# Patient Record
Sex: Female | Born: 1980
Health system: Southern US, Community
[De-identification: ages and names within clinical notes are randomized; demographics above are authoritative.]

## PROBLEM LIST (undated history)

## (undated) ENCOUNTER — Inpatient Hospital Stay (HOSPITAL_COMMUNITY): Payer: Self-pay

## (undated) DIAGNOSIS — D649 Anemia, unspecified: Secondary | ICD-10-CM

## (undated) DIAGNOSIS — F909 Attention-deficit hyperactivity disorder, unspecified type: Secondary | ICD-10-CM

## (undated) HISTORY — PX: DILATION AND CURETTAGE OF UTERUS: SHX78

## (undated) HISTORY — DX: Attention-deficit hyperactivity disorder, unspecified type: F90.9

## (undated) HISTORY — DX: Anemia, unspecified: D64.9

---
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
## 2015-10-31 DIAGNOSIS — G4700 Insomnia, unspecified: Secondary | ICD-10-CM | POA: Diagnosis not present

## 2015-10-31 DIAGNOSIS — F909 Attention-deficit hyperactivity disorder, unspecified type: Secondary | ICD-10-CM | POA: Diagnosis not present

## 2015-10-31 LAB — CBC AND DIFFERENTIAL
HEMATOCRIT: 39 (ref 36–46)
Hemoglobin: 13.1 (ref 12.0–16.0)
NEUTROS ABS: 5
Platelets: 311 (ref 150–399)
WBC: 7.2

## 2015-10-31 LAB — LIPID PANEL
Cholesterol: 179 (ref 0–200)
HDL: 72 — AB (ref 35–70)
LDL CALC: 96
TRIGLYCERIDES: 53 (ref 40–160)

## 2015-10-31 LAB — HEPATIC FUNCTION PANEL
ALT: 11 (ref 7–35)
AST: 15 (ref 13–35)
Alkaline Phosphatase: 29 (ref 25–125)
Bilirubin, Total: 0.4

## 2015-10-31 LAB — BASIC METABOLIC PANEL
BUN: 14 (ref 4–21)
Creatinine: 0.6 (ref <=1.1)
GLUCOSE: 95
Potassium: 4.3 (ref 3.4–5.3)
SODIUM: 138 (ref 137–147)

## 2015-10-31 LAB — HEMOGLOBIN A1C: Hgb A1c MFr Bld: 5.7 (ref 4.0–6.0)

## 2016-01-21 DIAGNOSIS — H5203 Hypermetropia, bilateral: Secondary | ICD-10-CM | POA: Diagnosis not present

## 2016-01-21 DIAGNOSIS — H52221 Regular astigmatism, right eye: Secondary | ICD-10-CM | POA: Diagnosis not present

## 2016-03-04 DIAGNOSIS — F909 Attention-deficit hyperactivity disorder, unspecified type: Secondary | ICD-10-CM | POA: Diagnosis not present

## 2016-04-08 MED FILL — DEXTROAMP-AMP 10 MG TAB: 10 | 30 days supply | Qty: 90 | Fill #0

## 2016-05-06 MED FILL — DEXTROAMP-AMP 10 MG TAB: 10 | 30 days supply | Qty: 90 | Fill #0

## 2016-06-12 DIAGNOSIS — F909 Attention-deficit hyperactivity disorder, unspecified type: Secondary | ICD-10-CM | POA: Diagnosis not present

## 2016-06-12 DIAGNOSIS — D229 Melanocytic nevi, unspecified: Secondary | ICD-10-CM | POA: Diagnosis not present

## 2016-06-15 MED FILL — AMPHETAMINE SALTS 10 MG TAB: 10 | 90 days supply | Qty: 90 | Fill #0

## 2016-07-20 MED FILL — DEXTROAMP-AMP 10 MG TAB: 10 | 30 days supply | Qty: 90 | Fill #0

## 2016-08-17 MED FILL — DEXTROAMP-AMP 10 MG TAB: 10 | 30 days supply | Qty: 90 | Fill #0

## 2016-09-07 HISTORY — PX: BREAST REDUCTION SURGERY: SHX8

## 2016-10-19 DIAGNOSIS — F909 Attention-deficit hyperactivity disorder, unspecified type: Secondary | ICD-10-CM | POA: Diagnosis not present

## 2016-10-20 MED FILL — DEXTROAMP-AMPHETAMIN 10 MG: 10 | 30 days supply | Qty: 90 | Fill #0

## 2016-10-30 ENCOUNTER — Inpatient Hospital Stay (HOSPITAL_COMMUNITY)
Admission: AD | Admit: 2016-10-30 | Discharge: 2016-10-30 | Disposition: A | Discharge status: Home / Self Care | Payer: 59 | Source: Ambulatory Visit | Attending: Obstetrics & Gynecology | Admitting: Obstetrics & Gynecology

## 2016-10-30 ENCOUNTER — Inpatient Hospital Stay (HOSPITAL_COMMUNITY): Payer: 59

## 2016-10-30 ENCOUNTER — Encounter (HOSPITAL_COMMUNITY): Payer: Self-pay | Admitting: *Deleted

## 2016-10-30 DIAGNOSIS — Z885 Allergy status to narcotic agent status: Secondary | ICD-10-CM | POA: Insufficient documentation

## 2016-10-30 DIAGNOSIS — Z3A09 9 weeks gestation of pregnancy: Secondary | ICD-10-CM | POA: Insufficient documentation

## 2016-10-30 DIAGNOSIS — Z881 Allergy status to other antibiotic agents status: Secondary | ICD-10-CM | POA: Insufficient documentation

## 2016-10-30 DIAGNOSIS — O021 Missed abortion: Secondary | ICD-10-CM

## 2016-10-30 DIAGNOSIS — O209 Hemorrhage in early pregnancy, unspecified: Secondary | ICD-10-CM | POA: Diagnosis present

## 2016-10-30 LAB — URINALYSIS, ROUTINE W REFLEX MICROSCOPIC
Bilirubin Urine: NEGATIVE
Glucose, UA: NEGATIVE mg/dL
KETONES UR: NEGATIVE mg/dL
Nitrite: NEGATIVE
PROTEIN: NEGATIVE mg/dL
Specific Gravity, Urine: 1.012 (ref 1.005–1.030)
pH: 5 (ref 5.0–8.0)

## 2016-10-30 LAB — CBC
HCT: 35.4 % — ABNORMAL LOW (ref 36.0–46.0)
Hemoglobin: 12.8 g/dL (ref 12.0–15.0)
MCH: 33.3 pg (ref 26.0–34.0)
MCHC: 36.2 g/dL — ABNORMAL HIGH (ref 30.0–36.0)
MCV: 92.2 fL (ref 78.0–100.0)
PLATELETS: 288 10*3/uL (ref 150–400)
RBC: 3.84 MIL/uL — AB (ref 3.87–5.11)
RDW: 12.4 % (ref 11.5–15.5)
WBC: 9.7 10*3/uL (ref 4.0–10.5)

## 2016-10-30 LAB — WET PREP, GENITAL
CLUE CELLS WET PREP: NONE SEEN
Sperm: NONE SEEN
TRICH WET PREP: NONE SEEN
Yeast Wet Prep HPF POC: NONE SEEN

## 2016-10-30 LAB — POCT PREGNANCY, URINE: PREG TEST UR: POSITIVE — AB

## 2016-10-30 LAB — ABO/RH: ABO/RH(D): A POS

## 2016-10-30 LAB — HCG, QUANTITATIVE, PREGNANCY: HCG, BETA CHAIN, QUANT, S: 67001 m[IU]/mL — AB (ref <5)

## 2016-10-30 MED ORDER — TRAMADOL HCL 50 MG PO TABS: 50.0000 mg | ORAL_TABLET | Freq: Four times a day (QID) | ORAL | 0 refills | Status: DC | PRN

## 2016-10-30 MED ORDER — PROMETHAZINE HCL 25 MG PO TABS: 25.0000 mg | ORAL_TABLET | Freq: Four times a day (QID) | ORAL | 2 refills | Status: DC | PRN

## 2016-10-30 MED ORDER — IBUPROFEN 800 MG PO TABS: 800.0000 mg | ORAL_TABLET | Freq: Three times a day (TID) | ORAL | 3 refills | Status: DC | PRN

## 2016-10-30 MED ORDER — MISOPROSTOL 200 MCG PO TABS: ORAL_TABLET | ORAL | 1 refills | Status: DC

## 2016-10-30 NOTE — MAU Note (Signed)
Had a lot of bleeding and clotting on 2/2, lasted a couple days.  Turned brown, small amt daily. No pain. Hx of miscarriages

## 2016-10-30 NOTE — MAU Provider Note (Signed)
[facility] MAU Attending Note  History     CSN: [id number]  Arrival date & time 10/30/16  1736   First Provider Initiated Contact with Patient 10/30/16 1827      Chief Complaint  Patient presents with  . Vaginal Bleeding  . Possible Pregnancy    Abigail Smith is a 36 y.o. G6P0050 at [redacted]w[redacted]d by LMP who presents to MAU today for evaluation of bleeding in early pregnancy.  Patient had bleeding on 10/09/16, no active bleeding after two days but persistent brown discharge. No abdominal cramping or other associated symptoms. She has a history of 2 previous SABs, she is worried she is having her third one now.  Had TAB x 3 (two surgical, one medication), previous GYN told her she may have intrauterine scarring, but she never underwent hysteroscopy for diagnosis.  Has not had any other recurrent SAB evaluation.  Denies any abnormal vaginal discharge, fevers, chills, sweats, dysuria, nausea, vomiting, other GI or GU symptoms or other general symptoms.   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 Accompanied by her FOB, who is a critical care RN at [employer].    Obstetric History   G6   P0   T0   P0   A5   L0    SAB2   TAB3   Ectopic0   Multiple0   Live Births0     # Outcome Date GA Lbr Len/2nd Weight Sex Delivery Anes PTL Lv  6 Current           5 SAB           4 SAB           3 TAB           2 TAB           1 TAB               History reviewed. No pertinent past medical history.  Past Surgical History:  Procedure Laterality Date  . BREAST REDUCTION SURGERY      Family History  Problem Relation Age of Onset  . Hypertension Mother   . Heart disease Brother     Social History  Substance Use Topics  . Smoking status: Never Smoker  . Smokeless tobacco: Never Used  . Alcohol use 1.8 - 2.4 oz/week    3 - 4 Glasses of wine per week    Allergies  Allergen Reactions  . Vicodin [Hydrocodone-Acetaminophen] Hives  . Vancomycin Other (See Comments)    Red  man syndrome    No prescriptions prior to admission.     Physical Exam  BP 128/77 (BP Location: Left Arm)   Pulse 81   Temp 98.2 F (36.8 C) (Oral)   Resp 18   Ht 5\' 7"  (1.702 m)   Wt 154 lb (69.9 kg)   LMP 08/28/2016   BMI 24.12 kg/m  GENERAL: Well-developed, well-nourished female in no acute distress  SKIN: Warm, dry and without erythema PSYCH: Normal mood and affect HEENT: Normocephalic, atraumatic.   LUNGS: Normal respiratory effort, normal breath sounds HEART: Regular rate noted ABDOMEN: Soft, nondistended, nontender  PELVIC: Small amount of brown discharge seen, no active bleeding.  Nulliparous cervix, closed. No abnormal discharge.  Samples obtained for testing. EXTREMITIES: No edema, no cyanosis, normal range of movement  MAU Course/MDM  Wet prep, GC/Chlam, UA sent Labs ordered: CBC, HIV, ABO, BHCG Pelvic ultrasound ordered.  Labs and Imaging   Results  for orders placed or performed during the hospital encounter of 10/30/16 (from the past 24 hour(s))  Urinalysis, Routine w reflex microscopic     Status: Abnormal   Collection Time: 10/30/16  5:40 PM  Result Value Ref Range   Color, Urine YELLOW YELLOW   APPearance CLOUDY (A) CLEAR   Specific Gravity, Urine 1.012 1.005 - 1.030   pH 5.0 5.0 - 8.0   Glucose, UA NEGATIVE NEGATIVE mg/dL   Hgb urine dipstick LARGE (A) NEGATIVE   Bilirubin Urine NEGATIVE NEGATIVE   Ketones, ur NEGATIVE NEGATIVE mg/dL   Protein, ur NEGATIVE NEGATIVE mg/dL   Nitrite NEGATIVE NEGATIVE   Leukocytes, UA LARGE (A) NEGATIVE   RBC / HPF 0-5 0 - 5 RBC/hpf   WBC, UA 6-30 0 - 5 WBC/hpf   Bacteria, UA RARE (A) NONE SEEN   Squamous Epithelial / LPF 6-30 (A) NONE SEEN   Mucous PRESENT   Pregnancy, urine POC     Status: Abnormal   Collection Time: 10/30/16  5:57 PM  Result Value Ref Range   Preg Test, Ur POSITIVE (A) NEGATIVE  CBC     Status: Abnormal   Collection Time: 10/30/16  6:08 PM  Result Value Ref Range   WBC 9.7 4.0 - 10.5 K/uL    RBC 3.84 (L) 3.87 - 5.11 MIL/uL   Hemoglobin 12.8 12.0 - 15.0 g/dL   HCT 35.4 (L) 36.0 - 46.0 %   MCV 92.2 78.0 - 100.0 fL   MCH 33.3 26.0 - 34.0 pg   MCHC 36.2 (H) 30.0 - 36.0 g/dL   RDW 12.4 11.5 - 15.5 %   Platelets 288 150 - 400 K/uL  ABO/Rh     Status: None (Preliminary result)   Collection Time: 10/30/16  6:08 PM  Result Value Ref Range   ABO/RH(D) A POS   hCG, quantitative, pregnancy     Status: Abnormal   Collection Time: 10/30/16  6:08 PM  Result Value Ref Range   hCG, Beta Chain, Quant, S 67,001 (H) <5 mIU/mL  Wet prep, genital     Status: Abnormal   Collection Time: 10/30/16  6:19 PM  Result Value Ref Range   Yeast Wet Prep HPF POC NONE SEEN NONE SEEN   Trich, Wet Prep NONE SEEN NONE SEEN   Clue Cells Wet Prep HPF POC NONE SEEN NONE SEEN   WBC, Wet Prep HPF POC MANY (A) NONE SEEN   Sperm NONE SEEN    Preliminary ultrasound report:  Likely twin missed abortion. One fetal pole, no cardiac activity. No fetal pole seen in other sac  Assessment and Plan   1. Missed abortion   2. Vaginal bleeding in pregnancy, first trimester    Discussed management of missed abortion: expectant management vs misoprostol vs D&E.  Risks and benefits of all modalities discussed; all questions answered.  Patient opted  For misoprostol administration.  The protocol/checklist is below:  Early Intrauterine Pregnancy Failure Treatment with Misoprostol  _X__  Documented intrauterine pregnancy failure less than or equal to [redacted] weeks gestation  __X_  No serious current illness  __X_  Baseline Hgb greater than or equal to 10g/dl  _X__  Patient has easily accessible transportation to the hospital  _X__  Clear preference  _X__  Practitioner/physician deems patient reliable  _X__  Counseling by practitioner or physician  _X__  Patient education by RN  _N/A__  Rho-Gam given by RN if indicated  __X_ Medication dispensed   _X__   Cytotec 800 mcg  __   Intravaginally by patient at  home         __   Intravaginally by RN in MAU/Clinic        __   Rectally by patient at home        __   Rectally by RN in MAU/Clinic  _X__  Ibuprofen 800 mg 1 tablet by mouth every 8 hours as needed #30  _X__  Tramadol 50 mg 1 tablet by mouth every 6 hours as needed  #20  ___  Phenergan 25 mg by mouth every 6 hours as needed for nausea #20  Risks and benefits of medical management were carefully explained, including a success rate of 80-90%, the need for another person to be at home with her, and to call/come in if she had heavy bleeding, dizziness, or severe pain not relieved by medication.  She will follow up in one week after misoprostol administration; if there has been no passage of pregnancy, will consider repeat misoprostol vs D&E.  Patient was advised to call or come in for evaluation for any concerning symptoms; bleeding precautions strictly emphasized.    Follow-up Information    Verita Schneiders, MD Follow up on 11/05/2016.   Specialty:  Obstetrics and Gynecology Why:  You will be called with appointment time Contact information: [address] [place] [address] [phone]          Allergies as of 10/30/2016      Reactions   Vicodin [hydrocodone-acetaminophen] Hives   Vancomycin Other (See Comments)   Red man syndrome      Medication List    TAKE these medications   ibuprofen 800 MG tablet Commonly known as:  ADVIL,MOTRIN Take 1 tablet (800 mg total) by mouth 3 (three) times daily with meals as needed for headache or moderate pain.   misoprostol 200 MCG tablet Commonly known as:  CYTOTEC Place four tablets in between your gums and cheeks (two tablets on each side) OR insert four tablets vaginally   promethazine 25 MG tablet Commonly known as:  PHENERGAN Take 1 tablet (25 mg total) by mouth every 6 (six) hours as needed for nausea or vomiting.   traMADol 50 MG tablet Commonly known as:  ULTRAM Take 1 tablet (50 mg total) by mouth every 6 (six) hours  as needed for severe pain.        Verita Schneiders, MD, [facility] Attending [place], [facility] for [employer], [place]

## 2016-10-30 NOTE — Discharge Instructions (Signed)
[email or web address] for support  Specialists: Dr. Kerin Perna, [facility] or [facility] on [address]?     Miscarriage A miscarriage is the sudden loss of an unborn baby (fetus) before the 20th week of pregnancy. Most miscarriages happen in the first 3 months of pregnancy. Sometimes, it happens before a woman even knows she is pregnant. A miscarriage is also called a "spontaneous miscarriage" or "early pregnancy loss." Having a miscarriage can be an emotional experience. Talk with your caregiver about any questions you may have about miscarrying, the grieving process, and your future pregnancy plans. What are the causes?  Problems with the fetal chromosomes that make it impossible for the baby to develop normally. Problems with the baby's genes or chromosomes are most often the result of errors that occur, by chance, as the embryo divides and grows. The problems are not inherited from the parents.  Infection of the cervix or uterus.  Hormone problems.  Problems with the cervix, such as having an incompetent cervix. This is when the tissue in the cervix is not strong enough to hold the pregnancy.  Problems with the uterus, such as an abnormally shaped uterus, uterine fibroids, or congenital abnormalities.  Certain medical conditions.  Smoking, drinking alcohol, or taking illegal drugs.  Trauma. Often, the cause of a miscarriage is unknown. What are the signs or symptoms?  Vaginal bleeding or spotting, with or without cramps or pain.  Pain or cramping in the abdomen or lower back.  Passing fluid, tissue, or blood clots from the vagina. How is this diagnosed? Your caregiver will perform a physical exam. You may also have an ultrasound to confirm the miscarriage. Blood or urine tests may also be ordered. How is this treated?  Sometimes, treatment is not necessary if you naturally pass all the fetal tissue that was in the uterus. If some of the fetus  or placenta remains in the body (incomplete miscarriage), tissue left behind may become infected and must be removed.   Medication can be used to help you pass the tissue  Dilation and curettage (D and C) procedure also can be performed. During a D and C procedure, the cervix is widened (dilated) and any remaining fetal or placental tissue is gently removed from the uterus.  Antibiotic medicines are prescribed if there is an infection. Other medicines may be given to reduce the size of the uterus (contract) if there is a lot of bleeding.  If you have Rh negative blood and your baby was Rh positive, you will need a Rh immunoglobulin shot. This shot will protect any future baby from having Rh blood problems in future pregnancies. Follow these instructions at home:  Your caregiver may order bed rest or may allow you to continue light activity. Resume activity as directed by your caregiver.  Have someone help with home and family responsibilities during this time.  Keep track of the number of sanitary pads you use each day and how soaked (saturated) they are. Write down this information.  Do not use tampons. Do not douche or have sexual intercourse until approved by your caregiver.  Only take over-the-counter or prescription medicines for pain or discomfort as directed by your caregiver.  Do not take aspirin. Aspirin can cause bleeding.  Keep all follow-up appointments with your caregiver.  If you or your partner have problems with grieving, talk to your caregiver or seek counseling to help cope with the pregnancy loss. Allow enough time to grieve before trying to get pregnant  again. Get help right away if:  You have severe cramps or pain in your back or abdomen.  You have a fever.  You pass large blood clots (walnut-sized or larger) ortissue from your vagina. Save any tissue for your caregiver to inspect.  Your bleeding increases.  You have a thick, bad-smelling vaginal  discharge.  You become lightheaded, weak, or you faint.  You have chills. This information is not intended to replace advice given to you by your health care provider. Make sure you discuss any questions you have with your health care provider. Document Released: 02/17/2001 Document Revised: 01/30/2016 Document Reviewed: 10/13/2011 Elsevier Interactive Patient Education  2017 [employer].

## 2016-10-31 LAB — HIV ANTIBODY (ROUTINE TESTING W REFLEX): HIV Screen 4th Generation wRfx: NONREACTIVE

## 2016-11-02 ENCOUNTER — Telehealth: Payer: Self-pay | Admitting: Radiology

## 2016-11-02 LAB — GC/CHLAMYDIA PROBE AMP (~~LOC~~) NOT AT ARMC
Chlamydia: NEGATIVE
Neisseria Gonorrhea: NEGATIVE

## 2016-11-02 MED FILL — miSOPROStol 200 MCG TABS: 200 | 1 days supply | Qty: 4 | Fill #0

## 2016-11-02 MED FILL — IBUPROFEN 800 MG TABLET: 800 | 10 days supply | Qty: 30 | Fill #0

## 2016-11-02 MED FILL — PROMETHAZINE 25 MG TABLET: 25 | 5 days supply | Qty: 20 | Fill #0

## 2016-11-02 NOTE — Telephone Encounter (Signed)
-----   Message from Osborne Oman, MD sent at 10/30/2016  7:40 PM EST ----- Regarding: Needs to follow up with me Please add her on for me on 11/05/16 for follow up miscarriage.  Thank you!  [facility]

## 2016-11-02 NOTE — Telephone Encounter (Signed)
Called and left a message on the cell phone voicemail to call office to schedule appointment for 11/05/16

## 2016-11-05 MED FILL — traMADol HCL 50 MG TABS: 50 | 5 days supply | Qty: 20 | Fill #0

## 2016-11-24 MED FILL — DEXTROAMP-AMPHETAMIN 10 MG: 10 | 30 days supply | Qty: 90 | Fill #0

## 2016-12-31 MED FILL — AMPHETAMINE SALTS 10 MG TAB: 10 | 30 days supply | Qty: 90 | Fill #0

## 2017-02-09 DIAGNOSIS — F909 Attention-deficit hyperactivity disorder, unspecified type: Secondary | ICD-10-CM | POA: Diagnosis not present

## 2017-02-09 DIAGNOSIS — L309 Dermatitis, unspecified: Secondary | ICD-10-CM | POA: Diagnosis not present

## 2017-02-10 MED FILL — DEXTROAMP-AMPHETAMIN 20 MG: 20 | 30 days supply | Qty: 60 | Fill #0

## 2017-03-19 MED FILL — DEXTROAMP-AMPHETAMIN 20 MG: 20 | 30 days supply | Qty: 60 | Fill #0

## 2017-05-12 MED FILL — AMPHETAMINE SALTS 20 MG TAB: 20 | 30 days supply | Qty: 60 | Fill #0

## 2017-05-25 MED FILL — IBUPROFEN 800 MG TABS: 800 | 10 days supply | Qty: 30 | Fill #1

## 2017-07-27 DIAGNOSIS — R7301 Impaired fasting glucose: Secondary | ICD-10-CM | POA: Diagnosis not present

## 2017-07-27 DIAGNOSIS — F909 Attention-deficit hyperactivity disorder, unspecified type: Secondary | ICD-10-CM | POA: Diagnosis not present

## 2017-07-28 MED FILL — DEXTROAMP-AMPHETAMIN 20 MG: 20 | 30 days supply | Qty: 60 | Fill #0

## 2017-08-27 ENCOUNTER — Encounter (HOSPITAL_COMMUNITY): Payer: Self-pay

## 2017-09-14 MED FILL — AMPHETAMINE-DEXTRO 20MG: 20 | 30 days supply | Qty: 60 | Fill #0

## 2017-09-29 MED FILL — IBUPROFEN 800 MG TABS: 800 | 10 days supply | Qty: 30 | Fill #2

## 2017-10-26 MED FILL — AMPHETAMINE-DEXTRO 20MG: 20 | 30 days supply | Qty: 60 | Fill #0

## 2018-01-19 ENCOUNTER — Ambulatory Visit (INDEPENDENT_AMBULATORY_CARE_PROVIDER_SITE_OTHER): Payer: No Typology Code available for payment source | Admitting: Adult Health

## 2018-01-19 ENCOUNTER — Encounter: Payer: Self-pay | Admitting: Adult Health

## 2018-01-19 VITALS — BP 115/74 | HR 84 | Ht 66.5 in | Wt 157.5 lb

## 2018-01-19 DIAGNOSIS — G4700 Insomnia, unspecified: Secondary | ICD-10-CM | POA: Diagnosis not present

## 2018-01-19 DIAGNOSIS — F411 Generalized anxiety disorder: Secondary | ICD-10-CM | POA: Diagnosis not present

## 2018-01-19 DIAGNOSIS — F909 Attention-deficit hyperactivity disorder, unspecified type: Secondary | ICD-10-CM

## 2018-01-19 DIAGNOSIS — Z0000 Encounter for general adult medical examination without abnormal findings: Secondary | ICD-10-CM | POA: Diagnosis not present

## 2018-01-19 MED ORDER — ZOLPIDEM TARTRATE ER 6.25 MG PO TBCR: 6.2500 mg | EXTENDED_RELEASE_TABLET | Freq: Every evening | ORAL | 0 refills | Status: DC | PRN

## 2018-01-19 MED ORDER — AMPHETAMINE-DEXTROAMPHETAMINE 20 MG PO TABS
20.0000 mg | ORAL_TABLET | Freq: Every day | ORAL | 0 refills | Status: DC | PRN
Start: 2018-01-19 — End: 2018-03-25

## 2018-01-19 NOTE — Assessment & Plan Note (Signed)
Failed previous Trazodone  Responded well to Zolpidem in the past [place] reviewed- no aberrancies noted Started on Zolpidem 6.25mg  QHS PRN Will adjust dosage as needed

## 2018-01-19 NOTE — Assessment & Plan Note (Signed)
Has been on Adderall 20mg  BID for >7 years [place] Controlled Substance Database reviewed-no aberrancies noted Will provide only one refill until records from previous PCP received. Once records reviewed, will allow up to three months total before next OV RF on ADderall 20mg  BID sent

## 2018-01-19 NOTE — Progress Notes (Signed)
Subjective:    Patient ID: Abigail Smith, female    DOB: 1981-04-26, 37 y.o.   MRN: [id number]  HPI:  Ms. Michl is here to establish as a new pt.  She is a pleasant 37 year old female.  PMH: ADHD, Anxiety, Insomnia, and infertility (followed by OB/GYN). She has been on Adderall 20mg  BID >7 years and denies SE.  She reports sig improvement in focus/concentration when using Adderall. 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 She has trailed Trazodone in past and reports "It would make me woozy but not sleepy".  She has been on Zolpidem in past and reports that it worked well. She has long standing hx of GAD and has been in/out of therapy for years and is open to re-establishing with a new therapist. She estimates to drink > 100 oz water/day and follows heart healthy diet. Due to work schedule it is difficult to find time to exercise, so she performs home calisthenics twice a week. She denies tobacco use and will occasionally have wine. She is married and has four dogs.  They are not actively pursuing becoming pregnant currently.  Patient Care Team    Relationship Specialty Notifications Start End  Esaw Grandchild, NP PCP - General Family Medicine  12/15/17     Patient Active Problem List   Diagnosis Date Noted  . Healthcare maintenance 01/19/2018  . ADHD 01/19/2018  . Insomnia 01/19/2018  . Vaginal bleeding in pregnancy, first trimester 10/30/2016     Past Medical History:  Diagnosis Date  . ADHD   . Anemia      Past Surgical History:  Procedure Laterality Date  . BREAST REDUCTION SURGERY    . BREAST SURGERY     reduction     Family History  Problem Relation Age of Onset  . Hypertension Mother   . Heart disease Brother   . Cancer Maternal Grandmother        lung  . Macular degeneration Maternal Grandmother   . Cancer Paternal Grandmother         stomach  . Alcohol abuse Paternal Grandfather   . Cancer Paternal Grandfather        esophageal     Social History   Substance and Sexual Activity  Drug Use No     Social History   Substance and Sexual Activity  Alcohol Use Yes  . Alcohol/week: 1.8 - 2.4 oz  . Types: 3 - 4 Glasses of wine per week     Social History   Tobacco Use  Smoking Status Never Smoker  Smokeless Tobacco Never Used     Outpatient Encounter Medications as of 01/19/2018  Medication Sig  . amphetamine-dextroamphetamine (ADDERALL) 20 MG tablet Take 1 tablet (20 mg total) by mouth daily as needed.  Marland Kitchen ibuprofen (ADVIL,MOTRIN) 800 MG tablet Take 1 tablet (800 mg total) by mouth 3 (three) times daily with meals as needed for headache or moderate pain.  . magnesium oxide (MAG-OX) 400 MG tablet Take 400 mg by mouth daily.  . Prenatal Vit-Fe Fumarate-FA (PRENATAL VITAMIN PO) Take 1 tablet by mouth daily.  . [DISCONTINUED] amphetamine-dextroamphetamine (ADDERALL) 20 MG tablet Take 1 tablet by mouth daily as needed.  . zolpidem (AMBIEN CR) 6.25 MG CR tablet Take 1 tablet (6.25 mg total) by mouth at bedtime as needed for sleep.  . [DISCONTINUED] misoprostol (CYTOTEC) 200 MCG tablet  Place four tablets in between your gums and cheeks (two tablets on each side) OR insert four tablets vaginally  . [DISCONTINUED] promethazine (PHENERGAN) 25 MG tablet Take 1 tablet (25 mg total) by mouth every 6 (six) hours as needed for nausea or vomiting.  . [DISCONTINUED] traMADol (ULTRAM) 50 MG tablet Take 1 tablet (50 mg total) by mouth every 6 (six) hours as needed for severe pain.   No facility-administered encounter medications on file as of 01/19/2018.     Allergies: Vicodin [hydrocodone-acetaminophen] and Vancomycin  Body mass index is 25.04 kg/m.  Blood pressure 115/74, pulse 84, height 5' 6.5" (1.689 m), weight 157 lb 8 oz (71.4 kg), last menstrual period 12/21/2017, SpO2 99 %, unknown if currently  breastfeeding.  Review of Systems  Constitutional: Positive for fatigue. Negative for activity change, appetite change, chills, diaphoresis, fever and unexpected weight change.  Eyes: Negative for visual disturbance.  Respiratory: Negative for cough, chest tightness, shortness of breath, wheezing and stridor.   Cardiovascular: Negative for chest pain, palpitations and leg swelling.  Gastrointestinal: Negative for abdominal distention, abdominal pain, blood in stool, constipation, diarrhea, nausea and vomiting.  Endocrine: Negative for cold intolerance, heat intolerance, polydipsia, polyphagia and polyuria.  Genitourinary: Negative for difficulty urinating and flank pain.  Musculoskeletal: Negative for arthralgias, back pain, gait problem, joint swelling, myalgias, neck pain and neck stiffness.  Skin: Negative for color change, pallor, rash and wound.  Neurological: Negative for headaches.  Hematological: Does not bruise/bleed easily.  Psychiatric/Behavioral: Positive for decreased concentration and sleep disturbance. Negative for agitation, behavioral problems, confusion, dysphoric mood, hallucinations, self-injury and suicidal ideas. The patient is nervous/anxious and is hyperactive.        Objective:   Physical Exam  Constitutional: She is oriented to person, place, and time. She appears well-developed and well-nourished. No distress.  HENT:  Head: Normocephalic and atraumatic.  Right Ear: External ear normal.  Left Ear: External ear normal.  Mouth/Throat: Oropharynx is clear and moist.  Eyes: Pupils are equal, round, and reactive to light. Conjunctivae and EOM are normal.  Cardiovascular: Normal rate, regular rhythm, normal heart sounds and intact distal pulses. Exam reveals no gallop and no friction rub.  No murmur heard. Pulmonary/Chest: Effort normal and breath sounds normal. No stridor. No respiratory distress. She has no wheezes. She has no rales. She exhibits no tenderness.   Neurological: She is alert and oriented to person, place, and time.  Skin: Skin is warm and dry. Capillary refill takes less than 2 seconds. No rash noted. She is not diaphoretic. No erythema.  Psychiatric: She has a normal mood and affect. Her behavior is normal. Judgment and thought content normal.  Nursing note and vitals reviewed.     Assessment & Plan:   1. Healthcare maintenance   2. Attention deficit hyperactivity disorder (ADHD), unspecified ADHD type   3. Insomnia, unspecified type   4. GAD (generalized anxiety disorder)     Insomnia Failed previous Trazodone  Responded well to Zolpidem in the past [place] Controlled Substance Database reviewed- no aberrancies noted Started on Zolpidem 6.25mg  QHS PRN Will adjust dosage as needed  ADHD Has been on Adderall 20mg  BID for >7 years [place] Controlled Substance Database reviewed-no aberrancies noted Will provide only one refill until records from previous PCP received. Once records reviewed, will allow up to three months total before next OV RF on ADderall 20mg  BID sent   Healthcare maintenance Continue all medications as directed. Please use Zolpidem 6.25mg  as needed for insomnia.  Will provide only one refill for Adderall 20mg  until records from previous PCP received. Once records reviewed, will allow up to three months total before next office visit. Referral to mental health placed. Recommend complete physical with fasting labs in 3 months.    FOLLOW-UP:  Return in about 3 months (around 04/21/2018) for CPE, Fasting Labs.

## 2018-01-19 NOTE — Patient Instructions (Signed)
Mediterranean Diet A Mediterranean diet refers to food and lifestyle choices that are based on the traditions of countries located on the [place]. This way of eating has been shown to help prevent certain conditions and improve outcomes for people who have chronic diseases, like kidney disease and heart disease. What are tips for following this plan? Lifestyle  Cook and eat meals together with your family, when possible.  Drink enough fluid to keep your urine clear or pale yellow.  Be physically active every day. This includes: ? Aerobic exercise like running or swimming. ? Leisure activities like gardening, walking, or housework.  Get 7-8 hours of sleep each night.  If recommended by your health care provider, drink red wine in moderation. This means 1 glass a day for nonpregnant women and 2 glasses a day for men. A glass of wine equals 5 oz (150 mL). Reading food labels  Check the serving size of packaged foods. For foods such as rice and pasta, the serving size refers to the amount of cooked product, not dry.  Check the total fat in packaged foods. Avoid foods that have saturated fat or trans fats.  Check the ingredients list for added sugars, such as corn syrup. Shopping  At the grocery store, buy most of your food from the areas near the walls of the store. This includes: ? Fresh fruits and vegetables (produce). ? Grains, beans, nuts, and seeds. Some of these may be available in unpackaged forms or large amounts (in bulk). ? Fresh seafood. ? Poultry and eggs. ? Low-fat dairy products.  Buy whole ingredients instead of prepackaged foods.  Buy fresh fruits and vegetables in-season from local farmers markets.  Buy frozen fruits and vegetables in resealable bags.  If you do not have access to quality fresh seafood, buy precooked frozen shrimp or canned fish, such as tuna, salmon, or sardines.  Buy small amounts of raw or cooked vegetables, salads, or olives from the  deli or salad bar at your store.  Stock your pantry so you always have certain foods on hand, such as olive oil, canned tuna, canned tomatoes, rice, pasta, and beans. Cooking  Cook foods with extra-virgin olive oil instead of using butter or other vegetable oils.  Have meat as a side dish, and have vegetables or grains as your main dish. This means having meat in small portions or adding small amounts of meat to foods like pasta or stew.  Use beans or vegetables instead of meat in common dishes like chili or lasagna.  Experiment with different cooking methods. Try roasting or broiling vegetables instead of steaming or sauteing them.  Add frozen vegetables to soups, stews, pasta, or rice.  Add nuts or seeds for added healthy fat at each meal. You can add these to yogurt, salads, or vegetable dishes.  Marinate fish or vegetables using olive oil, lemon juice, garlic, and fresh herbs. Meal planning  Plan to eat 1 vegetarian meal one day each week. Try to work up to 2 vegetarian meals, if possible.  Eat seafood 2 or more times a week.  Have healthy snacks readily available, such as: ? Vegetable sticks with hummus. ? Greek yogurt. ? Fruit and nut trail mix.  Eat balanced meals throughout the week. This includes: ? Fruit: 2-3 servings a day ? Vegetables: 4-5 servings a day ? Low-fat dairy: 2 servings a day ? Fish, poultry, or lean meat: 1 serving a day ? Beans and legumes: 2 or more servings a week ? Nuts   and seeds: 1-2 servings a day ? Whole grains: 6-8 servings a day ? Extra-virgin olive oil: 3-4 servings a day  Limit red meat and sweets to only a few servings a month What are my food choices?  Mediterranean diet ? Recommended ? Grains: Whole-grain pasta. Brown rice. Bulgar wheat. Polenta. Couscous. Whole-wheat bread. [place]. ? Vegetables: Artichokes. Beets. Broccoli. Cabbage. Carrots. Eggplant. Green beans. Chard. Kale. Spinach. Onions. Leeks. Peas. Squash.  Tomatoes. Peppers. Radishes. ? Fruits: Apples. Apricots. Avocado. Berries. Bananas. Cherries. Dates. Figs. Grapes. Lemons. Melon. Oranges. Peaches. Plums. Pomegranate. ? Meats and other protein foods: Beans. Almonds. Sunflower seeds. Pine nuts. Peanuts. [place]. Salmon. Scallops. Shrimp. [place]. Tilapia. Clams. Oysters. Eggs. ? Dairy: Low-fat milk. Cheese. Greek yogurt. ? Beverages: Water. Red wine. Herbal tea. ? Fats and oils: Extra virgin olive oil. Avocado oil. Grape seed oil. ? Sweets and desserts: [place] yogurt with honey. Baked apples. Poached pears. Trail mix. ? Seasoning and other foods: Basil. Cilantro. Coriander. Cumin. Mint. Parsley. Sage. Rosemary. Tarragon. Garlic. Oregano. Thyme. Pepper. Balsalmic vinegar. Tahini. Hummus. Tomato sauce. Olives. Mushrooms. ? Limit these ? Grains: Prepackaged pasta or rice dishes. Prepackaged cereal with added sugar. ? Vegetables: Deep fried potatoes (french fries). ? Fruits: Fruit canned in syrup. ? Meats and other protein foods: Beef. Pork. Lamb. Poultry with skin. Hot dogs. [place] Salines. ? Dairy: Ice cream. Sour cream. Whole milk. ? Beverages: Juice. Sugar-sweetened soft drinks. Beer. Liquor and spirits. ? Fats and oils: Butter. Canola oil. Vegetable oil. Beef fat (tallow). Lard. ? Sweets and desserts: Cookies. Cakes. Pies. Candy. ? Seasoning and other foods: Mayonnaise. Premade sauces and marinades. ? The items listed may not be a complete list. Talk with your dietitian about what dietary choices are right for you. Summary  The Mediterranean diet includes both food and lifestyle choices.  Eat a variety of fresh fruits and vegetables, beans, nuts, seeds, and whole grains.  Limit the amount of red meat and sweets that you eat.  Talk with your health care provider about whether it is safe for you to drink red wine in moderation. This means 1 glass a day for nonpregnant women and 2 glasses a day for men. A glass of wine equals 5 oz (150 mL). This information  is not intended to replace advice given to you by your health care provider. Make sure you discuss any questions you have with your health care provider. Document Released: 04/16/2016 Document Revised: 05/19/2016 Document Reviewed: 04/16/2016 Elsevier Interactive Patient Education  2018 [employer].  Insomnia Insomnia is a sleep disorder that makes it difficult to fall asleep or to stay asleep. Insomnia can cause tiredness (fatigue), low energy, difficulty concentrating, mood swings, and poor performance at work or school. There are three different ways to classify insomnia:  Difficulty falling asleep.  Difficulty staying asleep.  Waking up too early in the morning.  Any type of insomnia can be long-term (chronic) or short-term (acute). Both are common. Short-term insomnia usually lasts for three months or less. Chronic insomnia occurs at least three times a week for longer than three months. What are the causes? Insomnia may be caused by another condition, situation, or substance, such as:  Anxiety.  Certain medicines.  Gastroesophageal reflux disease (GERD) or other gastrointestinal conditions.  Asthma or other breathing conditions.  Restless legs syndrome, sleep apnea, or other sleep disorders.  Chronic pain.  Menopause. This may include hot flashes.  Stroke.  Abuse of alcohol, tobacco, or illegal drugs.  Depression.  Caffeine.  Neurological disorders,  such as Alzheimer disease.  An overactive thyroid (hyperthyroidism).  The cause of insomnia may not be known. What increases the risk? Risk factors for insomnia include:  Gender. Women are more commonly affected than men.  Age. Insomnia is more common as you get older.  Stress. This may involve your professional or personal life.  Income. Insomnia is more common in people with lower income.  Lack of exercise.  Irregular work schedule or night shifts.  Traveling between different time zones.  What are  the signs or symptoms? If you have insomnia, trouble falling asleep or trouble staying asleep is the main symptom. This may lead to other symptoms, such as:  Feeling fatigued.  Feeling nervous about going to sleep.  Not feeling rested in the morning.  Having trouble concentrating.  Feeling irritable, anxious, or depressed.  How is this treated? Treatment for insomnia depends on the cause. If your insomnia is caused by an underlying condition, treatment will focus on addressing the condition. Treatment may also include:  Medicines to help you sleep.  Counseling or therapy.  Lifestyle adjustments.  Follow these instructions at home:  Take medicines only as directed by your health care provider.  Keep regular sleeping and waking hours. Avoid naps.  Keep a sleep diary to help you and your health care provider figure out what could be causing your insomnia. Include: ? When you sleep. ? When you wake up during the night. ? How well you sleep. ? How rested you feel the next day. ? Any side effects of medicines you are taking. ? What you eat and drink.  Make your bedroom a comfortable place where it is easy to fall asleep: ? Put up shades or special blackout curtains to block light from outside. ? Use a white noise machine to block noise. ? Keep the temperature cool.  Exercise regularly as directed by your health care provider. Avoid exercising right before bedtime.  Use relaxation techniques to manage stress. Ask your health care provider to suggest some techniques that may work well for you. These may include: ? Breathing exercises. ? Routines to release muscle tension. ? Visualizing peaceful scenes.  Cut back on alcohol, caffeinated beverages, and cigarettes, especially close to bedtime. These can disrupt your sleep.  Do not overeat or eat spicy foods right before bedtime. This can lead to digestive discomfort that can make it hard for you to sleep.  Limit screen use  before bedtime. This includes: ? Watching TV. ? Using your smartphone, tablet, and computer.  Stick to a routine. This can help you fall asleep faster. Try to do a quiet activity, brush your teeth, and go to bed at the same time each night.  Get out of bed if you are still awake after 15 minutes of trying to sleep. Keep the lights down, but try reading or doing a quiet activity. When you feel sleepy, go back to bed.  Make sure that you drive carefully. Avoid driving if you feel very sleepy.  Keep all follow-up appointments as directed by your health care provider. This is important. Contact a health care provider if:  You are tired throughout the day or have trouble in your daily routine due to sleepiness.  You continue to have sleep problems or your sleep problems get worse. Get help right away if:  You have serious thoughts about hurting yourself or someone else. This information is not intended to replace advice given to you by your health care provider. Make sure you  discuss any questions you have with your health care provider. Document Released: 08/21/2000 Document Revised: 01/24/2016 Document Reviewed: 05/25/2014 Elsevier Interactive Patient Education  2018 [place] With Attention Deficit Hyperactivity Disorder If you have been diagnosed with attention deficit hyperactivity disorder (ADHD), you may be relieved that you now know why you have felt or behaved a certain way. Still, you may feel overwhelmed about the treatment ahead. You may also wonder how to get the support you need and how to deal with the condition day-to-day. With treatment and support, you can live with ADHD and manage your symptoms. How to manage lifestyle changes Managing stress Stress is your body's reaction to life changes and events, both good and bad. To cope with the stress of an ADHD diagnosis, it may help to:  Learn more about ADHD.  Exercise regularly. Even a short daily walk can lower  stress levels.  Participate in training or education programs (including social skills training classes) that teach you to deal with symptoms.  Medicines Your health care provider may suggest certain medicines if he or she feels that they will help to improve your condition. Stimulant medicines are usually prescribed to treat ADHD, and therapy may also be prescribed. It is important to:  Avoid using alcohol and other substances that may prevent your medicines from working properly [facility]).  Talk with your pharmacist or health care provider about all the medicines that you take, their possible side effects, and what medicines are safe to take together.  Make it your goal to take part in all treatment decisions (shared decision-making). Ask about possible side effects of medicines that your health care provider recommends, and tell him or her how you feel about having those side effects. It is best if shared decision-making with your health care provider is part of your total treatment plan.  Relationships To strengthen your relationships with family members while treating your condition, consider taking part in family therapy. You might also attend self-help groups alone or with a loved one. Be honest about how your symptoms affect your relationships. Make an effort to communicate respectfully instead of fighting, and find ways to show others that you care. Psychotherapy may be useful in helping you cope with how ADHD affects your relationships. How to recognize changes in your condition The following signs may mean that your treatment is working well and your condition is improving:  Consistently being on time for appointments.  Being more organized at home and work.  Other people noticing improvements in your behavior.  Achieving goals that you set for yourself.  Thinking more clearly.  The following signs may mean that your treatment is not working very well:  Feeling  impatience or more confusion.  Missing, forgetting, or being late for appointments.  An increasing sense of disorganization and messiness.  More difficulty in reaching goals that you set for yourself.  Loved ones becoming angry or frustrated with you.  Where to find support Talking to others  Keep emotion out of important discussions and speak in a calm, logical way.  Listen closely and patiently to your loved ones. Try to understand their point of view, and try to avoid getting defensive.  Take responsibility for the consequences of your actions.  Ask that others do not take your behaviors personally.  Aim to solve problems as they come up, and express your feelings instead of bottling them up.  Talk openly about what you need from your loved ones and how they  can support you.  Consider going to family therapy sessions or having your family meet with a specialist who deals with ADHD-related behavior problems. Finances Not all insurance plans cover mental health care, so it is important to check with your insurance carrier. If paying for co-pays or counseling services is a problem, search for a local or county mental health care center. Public mental health care services may be offered there at a low cost or no cost when you are not able to see a private health care provider. If you are taking medicine for ADHD, you may be able to get the generic form, which may be less expensive than brand-name medicine. Some makers of prescription medicines also offer help to patients who cannot afford the medicines that they need. Follow these instructions at home:  Take over-the-counter and prescription medicines only as told by your health care provider. Check with your health care provider before taking any new medicines.  Create structure and an organized atmosphere at home. For example: ? Make a list of tasks, then rank them from most important to least important. Work on one task at a time  until your listed tasks are done. ? Make a daily schedule and follow it consistently every day. ? Use an appointment calendar, and check it 2 or 3 times a day to keep on track. Keep it with you when you leave the house. ? Create spaces where you keep certain things, and always put things back in their places after you use them.  Keep all follow-up visits as told by your health care provider. This is important. Questions to ask your health care provider:  What are the risks and benefits of taking medicines?  Would I benefit from therapy?  How often should I follow up with a health care provider? Contact a health care provider if:  You have side effects from your medicines, such as: ? Repeated muscle twitches, coughing, or speech outbursts. ? Sleep problems. ? Loss of appetite. ? Depression. ? New or worsening behavior problems. ? Dizziness. ? Unusually fast heartbeat. ? Stomach pains. ? Headaches. Get help right away if:  You have a severe reaction to a medicine.  Your behavior suddenly gets worse. Summary  With treatment and support, you can live with ADHD and manage your symptoms.  The medicines that are most often prescribed for ADHD are stimulants.  Consider taking part in family therapy or self-help groups with family members or friends.  When you talk with friends and family about your ADHD, be patient and communicate openly.  Take over-the-counter and prescription medicines only as told by your health care provider. Check with your health care provider before taking any new medicines. This information is not intended to replace advice given to you by your health care provider. Make sure you discuss any questions you have with your health care provider. Document Released: 12/24/2016 Document Revised: 12/24/2016 Document Reviewed: 12/24/2016 Elsevier Interactive Patient Education  2018 [place] all medications as directed. Please use Zolpidem 6.25mg   as needed for insomnia. Will provide only one refill for Adderall 20mg  until records from previous PCP received. Once records reviewed, will allow up to three months total before next office visit. Referral to mental health placed. Recommend complete physical with fasting labs in 3 months. WELCOME TO THE PRACTICE!

## 2018-01-19 NOTE — Assessment & Plan Note (Signed)
Continue all medications as directed. Please use Zolpidem 6.25mg  as needed for insomnia. Will provide only one refill for Adderall 20mg  until records from previous PCP received. Once records reviewed, will allow up to three months total before next office visit. Referral to mental health placed. Recommend complete physical with fasting labs in 3 months.

## 2018-02-01 MED FILL — DEXTROAMP-AMPHETAMIN 20 MG: 20 | 60 days supply | Qty: 60 | Fill #0

## 2018-02-01 MED FILL — ZOLPIDEM TART ER 6.25 MG TA: 6.25 | 30 days supply | Qty: 30 | Fill #0

## 2018-02-04 ENCOUNTER — Encounter: Payer: No Typology Code available for payment source | Admitting: Adult Health

## 2018-02-10 ENCOUNTER — Encounter: Payer: Self-pay | Admitting: Obstetrics and Gynecology

## 2018-02-17 ENCOUNTER — Ambulatory Visit (INDEPENDENT_AMBULATORY_CARE_PROVIDER_SITE_OTHER): Payer: No Typology Code available for payment source | Admitting: Obstetrics and Gynecology

## 2018-02-17 ENCOUNTER — Telehealth: Payer: Self-pay | Admitting: Obstetrics and Gynecology

## 2018-02-17 ENCOUNTER — Encounter: Payer: Self-pay | Admitting: Obstetrics and Gynecology

## 2018-02-17 ENCOUNTER — Ambulatory Visit
Admission: RE | Admit: 2018-02-17 | Discharge: 2018-02-17 | Disposition: A | Discharge status: Home / Self Care | Payer: No Typology Code available for payment source | Source: Ambulatory Visit | Attending: Obstetrics and Gynecology | Admitting: Obstetrics and Gynecology

## 2018-02-17 DIAGNOSIS — O209 Hemorrhage in early pregnancy, unspecified: Secondary | ICD-10-CM | POA: Insufficient documentation

## 2018-02-17 DIAGNOSIS — O09511 Supervision of elderly primigravida, first trimester: Secondary | ICD-10-CM | POA: Insufficient documentation

## 2018-02-17 DIAGNOSIS — O0990 Supervision of high risk pregnancy, unspecified, unspecified trimester: Secondary | ICD-10-CM | POA: Insufficient documentation

## 2018-02-17 DIAGNOSIS — Z3A08 8 weeks gestation of pregnancy: Secondary | ICD-10-CM | POA: Diagnosis not present

## 2018-02-17 DIAGNOSIS — O021 Missed abortion: Secondary | ICD-10-CM | POA: Insufficient documentation

## 2018-02-17 NOTE — Progress Notes (Signed)
Last pap 10/08/2016

## 2018-02-17 NOTE — Progress Notes (Signed)
DATING AND VIABILITY SONOGRAM   Abigail Smith is a 37 y.o. year old [id number] with LMP Patient's last menstrual period was 12/14/2017. which would correlate to  [redacted]w[redacted]d weeks gestation.  She has regular menstrual cycles.   She is here today for a confirmatory initial sonogram.    GESTATION: [place]     FETAL ACTIVITY:          Heart rate: Not Seen          The fetus is inactive.  GESTATIONAL AGE AND  BIOMETRICS:  Gestational criteria: Estimated Date of Delivery: 09/20/18 by LMP now at [redacted]w[redacted]d  Previous Scans:0  CRL 1.39cm 7.4wks  GSD 2.64cm 7.4wks                                                   AVERAGE EGA(BY THIS SCAN):  7.4 weeks  WORKING EDD( LMP ):  09/20/2018     TECHNICIAN COMMENTS:  IUP seen on bedside US measuring 7.4wks by CRL. No fetal heart rate seen.

## 2018-02-17 NOTE — Progress Notes (Signed)
New OB Note  Concern for missed ab and embryonic demise based on in office transvaginal u/s today showing CRL 1.39cm c/w 7/4 weeks and no FHR seen, with it being approx 2wk lag vs her LMP which she states was regular and qmonth; pt notes mild nausea w/o vomiting and some breast tenderness.   D/w her concern for non viable pregnancy. Will send for viability u/s for today and f/u with her later.  Durene Romans MD Attending Center for [facility] (Faculty Practice) 02/17/2018 Time: 1105am

## 2018-02-17 NOTE — Telephone Encounter (Signed)
GYN Telephone Note  Patient called at [phone] and automatically went to a generic VM.  Patient called again and

## 2018-02-17 NOTE — Patient Instructions (Signed)
Magnesium malate or taurate appear to be better than oxide or citrate and is still safe with pregnancy. The dosing is the same at 400-500mg  by mouth daily.

## 2018-02-17 NOTE — Telephone Encounter (Signed)
GYN Telephone Note Patient called at [phone] and no answer. VM left again; will try again tomorrow.  Durene Romans MD Attending [facility] for [facility] (Faculty Practice) 02/17/2018 Time: [phone]

## 2018-02-18 ENCOUNTER — Encounter: Payer: Self-pay | Admitting: Adult Health

## 2018-02-18 ENCOUNTER — Telehealth: Payer: Self-pay | Admitting: Obstetrics and Gynecology

## 2018-02-18 NOTE — Telephone Encounter (Signed)
GYN Telephone Note  Patient called at [phone] and it went directly VM. VM left asking patient to call clinic to go over u/s results.  Durene Romans MD [facility]ending [facility] (Faculty Practice) 02/18/2018 Time: 0902  '

## 2018-02-18 NOTE — Telephone Encounter (Signed)
Patient called at 4:55pm yesterday wanting to get her test results from ultrasound that was performed yesterday. Results were discussed with patient that there was no cardiac visualized and it looks like a failed pregnancy at this time. Patient informed that provider was not in office at this time but would be contacting her regarding next step. Patient verbalizes understanding at this time.

## 2018-02-21 ENCOUNTER — Telehealth: Payer: Self-pay | Admitting: Obstetrics and Gynecology

## 2018-02-21 NOTE — Telephone Encounter (Signed)
GYN Telephone Note Patient called at [phone] and it rang but no answer. Generic VM picked up and VM left asking patient to call the office as soon as she gets a chance.  Please have patient speak with an MD, if one is available in the office or call [phone] and have the attending contact her to go over next steps, since I am post call today.   I will call her, again, tomorrow, if no progress is made today  Durene Romans MD Attending [facility] (Faculty Practice) 02/21/2018 Time: [phone]

## 2018-02-22 ENCOUNTER — Telehealth: Payer: Self-pay | Admitting: Obstetrics and Gynecology

## 2018-02-22 NOTE — Telephone Encounter (Signed)
GYN Telephone Note Patient called and no answer.   Durene Romans MD Attending [facility] for [facility] (Faculty Practice) 02/22/2018 Time: [phone]

## 2018-02-23 ENCOUNTER — Telehealth: Payer: Self-pay | Admitting: *Deleted

## 2018-02-23 ENCOUNTER — Encounter: Payer: Self-pay | Admitting: Obstetrics and Gynecology

## 2018-02-23 ENCOUNTER — Telehealth: Payer: Self-pay | Admitting: Obstetrics and Gynecology

## 2018-02-23 NOTE — Telephone Encounter (Signed)
GYN Telephone Note Patient called and results d/w her. I told her I recommend d&c given how far along the pregnancy is measuring. Pt to consider and let us know, and if she'd like it with me or just with first availability.   Durene Romans MD Attending [facility] (Faculty Practice) 02/23/2018 Time: [phone]

## 2018-02-23 NOTE — Telephone Encounter (Signed)
Tried reaching pt for Dr. Ilda Basset to go over results. LM on [phone] and [phone] for pt to call back.

## 2018-02-25 ENCOUNTER — Other Ambulatory Visit: Payer: Self-pay | Admitting: Obstetrics and Gynecology

## 2018-02-25 ENCOUNTER — Encounter: Payer: Self-pay | Admitting: Obstetrics and Gynecology

## 2018-02-25 ENCOUNTER — Telehealth: Payer: Self-pay

## 2018-02-25 MED ORDER — MISOPROSTOL 200 MCG PO TABS: ORAL_TABLET | ORAL | 0 refills | Status: DC

## 2018-02-25 MED ORDER — IBUPROFEN 600 MG PO TABS: 600.0000 mg | ORAL_TABLET | Freq: Four times a day (QID) | ORAL | 0 refills | Status: DC | PRN

## 2018-02-25 MED ORDER — PROMETHAZINE HCL 25 MG PO TABS: 25.0000 mg | ORAL_TABLET | Freq: Four times a day (QID) | ORAL | 0 refills | Status: DC | PRN

## 2018-02-25 MED ORDER — ACETAMINOPHEN-CODEINE #3 300-30 MG PO TABS: 1.0000 | ORAL_TABLET | Freq: Four times a day (QID) | ORAL | 0 refills | Status: DC | PRN

## 2018-02-25 MED FILL — IBUPROFEN 600 MG TABLET: 600 | 8 days supply | Qty: 30 | Fill #0

## 2018-02-25 MED FILL — PROMETHAZINE 25 MG TABLET: 25 | 8 days supply | Qty: 30 | Fill #0

## 2018-02-25 MED FILL — miSOPROStol 200 MCG TABS: 200 | 1 days supply | Qty: 4 | Fill #0

## 2018-02-25 NOTE — Telephone Encounter (Signed)
Patient called requesting medication be called into her current pharmacy to start the process of D/C. Called Dr.Pickens who will send in all medications for patient to start today. Advised patient to go to  [facility] clinic to pick up narotic pain medications since this can not be called into her pharmacy. Advised patient of clinic hours and location. Patient voice understanding at this time.

## 2018-02-25 NOTE — Progress Notes (Signed)
Tylenol #3 Rx and instructions for medication usage left at front desk of [facility]  Aletha Halim, Brooke Bonito MD Attending Center for [place] (Faculty Practice) 02/25/2018 Time: 1225pm

## 2018-02-25 NOTE — Progress Notes (Signed)
Patient would like to try medical management of failed pregnancy. D/w pt increased risk of it not working due to her being so far along, and I recommend d&c but she would like to medical management. Only clinic open to hold her tylenol #3 Rx is at [facility] [place] to call patient and let her know and I sent her an in basket message to, since that is how she let me know how she'd like to proceed. Pt is A POS and has had normal CBCs. In basket request sent to [facility] for 2wk f/u visit for transvag u/s to make sure no rPOCs  Aletha Halim, Brooke Bonito MD Attending [facility] for [facility] (Faculty Practice) 02/25/2018 Time: 1210pm

## 2018-02-28 ENCOUNTER — Telehealth: Payer: Self-pay | Admitting: Radiology

## 2018-02-28 NOTE — Telephone Encounter (Signed)
Left voicemail for patient to call [facility] to get date that she will be able to go to [facility] for transvaginal US.

## 2018-03-01 ENCOUNTER — Telehealth: Payer: Self-pay | Admitting: Radiology

## 2018-03-01 NOTE — Telephone Encounter (Signed)
Left voicemail for patient to call [id number], need dates and times that she can go for an [place] that has been requested and ordered by Dr Ilda Basset.

## 2018-03-02 MED FILL — ACETAMINOPHEN/COD #3 TABLET: 300-30 | 2 days supply | Qty: 12 | Fill #0

## 2018-03-03 ENCOUNTER — Encounter (INDEPENDENT_AMBULATORY_CARE_PROVIDER_SITE_OTHER): Payer: Self-pay

## 2018-03-24 ENCOUNTER — Encounter: Payer: Self-pay | Admitting: Adult Health

## 2018-03-24 ENCOUNTER — Other Ambulatory Visit: Payer: Self-pay | Admitting: Adult Health

## 2018-03-24 MED ORDER — ZOLPIDEM TARTRATE ER 12.5 MG PO TBCR: 12.5000 mg | EXTENDED_RELEASE_TABLET | Freq: Every day | ORAL | 0 refills | Status: DC

## 2018-03-25 ENCOUNTER — Other Ambulatory Visit: Payer: Self-pay | Admitting: Adult Health

## 2018-03-25 MED FILL — ZOLPIDEM TART ER 12.5 MG TA: 12.5 | 30 days supply | Qty: 30 | Fill #0

## 2018-03-28 ENCOUNTER — Other Ambulatory Visit: Payer: Self-pay | Admitting: Adult Health

## 2018-03-28 MED ORDER — AMPHETAMINE-DEXTROAMPHETAMINE 20 MG PO TABS: 20.0000 mg | ORAL_TABLET | Freq: Two times a day (BID) | ORAL | 0 refills | Status: DC

## 2018-03-28 MED ORDER — ZOLPIDEM TARTRATE ER 12.5 MG PO TBCR: 12.5000 mg | EXTENDED_RELEASE_TABLET | Freq: Every day | ORAL | 0 refills | Status: DC

## 2018-03-28 MED FILL — AMPHETAMINE SALTS 20 MG TAB: 20 | 30 days supply | Qty: 60 | Fill #0

## 2018-04-26 ENCOUNTER — Other Ambulatory Visit: Payer: No Typology Code available for payment source

## 2018-05-02 ENCOUNTER — Ambulatory Visit: Payer: No Typology Code available for payment source

## 2018-05-04 NOTE — Progress Notes (Deleted)
Subjective:    Patient ID: Abigail Smith, female    DOB: [birth year], 37 y.o.   MRN: [id number]  HPI: 01/19/18 OV:  Abigail Smith is here to establish as a new pt.  She is a pleasant 37 year old female.  PMH: ADHD, Anxiety, Insomnia, and infertility (followed by OB/GYN). She has been on Adderall 20mg  BID >7 years and denies SE.  She reports sig improvement in focus/concentration when using Adderall. 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 She has trailed Trazodone in past and reports "It would make me woozy but not sleepy".  She has been on Zolpidem in past and reports that it worked well. She has long standing hx of GAD and has been in/out of therapy for years and is open to re-establishing with a new therapist. She estimates to drink > 100 oz water/day and follows heart healthy diet. Due to work schedule it is difficult to find time to exercise, so she performs home calisthenics twice a week. She denies tobacco use and will occasionally have wine. She is married and has four dogs.  They are not actively pursuing becoming pregnant currently.  05/05/18 OV: Abigail Smith is here for regular f/u: ADHD  Patient Care Team    Relationship Specialty Notifications Start End  Esaw Grandchild, NP PCP - General Family Medicine  12/15/17   Osborne Oman, MD Attending Physician Obstetrics and Gynecology  01/19/18     Patient Active Problem List   Diagnosis Date Noted  . Supervision of high risk pregnancy, antepartum 02/17/2018  . AMA (advanced maternal age) primigravida 85+, first trimester 02/17/2018  . Missed abortion with fetal demise before 55 completed weeks of gestation 02/17/2018  . Healthcare maintenance 01/19/2018  . ADHD 01/19/2018  . Insomnia 01/19/2018     Past Medical History:  Diagnosis Date  . ADHD   . Anemia      Past Surgical History:  Procedure  Laterality Date  . BREAST REDUCTION SURGERY  2018  . DILATION AND CURETTAGE OF UTERUS     EAB     Family History  Problem Relation Age of Onset  . Hypertension Mother   . Heart disease Brother   . Cancer Maternal Grandmother        lung  . Macular degeneration Maternal Grandmother   . Cancer Paternal Grandmother        stomach  . Alcohol abuse Paternal Grandfather   . Cancer Paternal Grandfather        esophageal     Social History   Substance and Sexual Activity  Drug Use No     Social History   Substance and Sexual Activity  Alcohol Use Not Currently  . Alcohol/week: 3.0 - 4.0 standard drinks  . Types: 3 - 4 Glasses of wine per week     Social History   Tobacco Use  Smoking Status Never Smoker  Smokeless Tobacco Never Used     Outpatient Encounter Medications as of 05/05/2018  Medication Sig  . acetaminophen-codeine (TYLENOL #3) 300-30 MG tablet Take 1-2 tablets by mouth every 6 (six) hours as needed for moderate pain.  Marland Kitchen amphetamine-dextroamphetamine (ADDERALL) 20 MG tablet Take 1 tablet (20 mg total) by mouth 2 (two) times daily.  Marland Kitchen ibuprofen (ADVIL,MOTRIN) 600 MG tablet Take 1 tablet (600 mg total) by mouth every 6 (six) hours as needed.  Marland Kitchen ibuprofen (  ADVIL,MOTRIN) 800 MG tablet Take 1 tablet (800 mg total) by mouth 3 (three) times daily with meals as needed for headache or moderate pain. (Patient not taking: Reported on 02/17/2018)  . magnesium oxide (MAG-OX) 400 MG tablet Take 400 mg by mouth daily.  . misoprostol (CYTOTEC) 200 MCG tablet Place 4 tablets in the vagina.  . Prenatal Vit-Fe Fumarate-FA (PRENATAL VITAMIN PO) Take 1 tablet by mouth daily.  . promethazine (PHENERGAN) 25 MG tablet Take 1 tablet (25 mg total) by mouth every 6 (six) hours as needed for nausea or vomiting.  Marland Kitchen zolpidem (AMBIEN CR) 12.5 MG CR tablet Take 1 tablet (12.5 mg total) by mouth at bedtime.   No facility-administered encounter medications on file as of 05/05/2018.      Allergies: Vicodin [hydrocodone-acetaminophen] and Vancomycin  There is no height or weight on file to calculate BMI.  Last menstrual period 12/14/2017, unknown if currently breastfeeding.  Review of Systems  Constitutional: Positive for fatigue. Negative for activity change, appetite change, chills, diaphoresis, fever and unexpected weight change.  Eyes: Negative for visual disturbance.  Respiratory: Negative for cough, chest tightness, shortness of breath, wheezing and stridor.   Cardiovascular: Negative for chest pain, palpitations and leg swelling.  Gastrointestinal: Negative for abdominal distention, abdominal pain, blood in stool, constipation, diarrhea, nausea and vomiting.  Endocrine: Negative for cold intolerance, heat intolerance, polydipsia, polyphagia and polyuria.  Genitourinary: Negative for difficulty urinating and flank pain.  Musculoskeletal: Negative for arthralgias, back pain, gait problem, joint swelling, myalgias, neck pain and neck stiffness.  Skin: Negative for color change, pallor, rash and wound.  Neurological: Negative for headaches.  Hematological: Does not bruise/bleed easily.  Psychiatric/Behavioral: Positive for decreased concentration and sleep disturbance. Negative for agitation, behavioral problems, confusion, dysphoric mood, hallucinations, self-injury and suicidal ideas. The patient is nervous/anxious and is hyperactive.        Objective:   Physical Exam  Constitutional: She is oriented to person, place, and time. She appears well-developed and well-nourished. No distress.  HENT:  Head: Normocephalic and atraumatic.  Right Ear: External ear normal.  Left Ear: External ear normal.  Mouth/Throat: Oropharynx is clear and moist.  Eyes: Pupils are equal, round, and reactive to light. Conjunctivae and EOM are normal.  Cardiovascular: Normal rate, regular rhythm, normal heart sounds and intact distal pulses. Exam reveals no gallop and no friction rub.   No murmur heard. Pulmonary/Chest: Effort normal and breath sounds normal. No stridor. No respiratory distress. She has no wheezes. She has no rales. She exhibits no tenderness.  Neurological: She is alert and oriented to person, place, and time.  Skin: Skin is warm and dry. Capillary refill takes less than 2 seconds. No rash noted. She is not diaphoretic. No erythema.  Psychiatric: She has a normal mood and affect. Her behavior is normal. Judgment and thought content normal.  Nursing note and vitals reviewed.     Assessment & Plan:   No diagnosis found.  No problem-specific Assessment & Plan notes found for this encounter.    FOLLOW-UP:  No follow-ups on file.

## 2018-05-05 ENCOUNTER — Ambulatory Visit: Payer: No Typology Code available for payment source | Admitting: Adult Health

## 2018-05-05 ENCOUNTER — Other Ambulatory Visit: Payer: Self-pay | Admitting: Adult Health

## 2018-05-05 MED FILL — ZOLPIDEM TART ER 12.5 MG TA: 12.5 | 30 days supply | Qty: 30 | Fill #0

## 2018-05-05 NOTE — Telephone Encounter (Signed)
Good Morning Tonya, Per my OV May 2019, she needs OV Q3M for Adderall to be refilled. Will refill once she is seen. Thanks! Valetta Fuller

## 2018-05-05 NOTE — Telephone Encounter (Signed)
Pt cancelled her f/u appt today for ADHD medication recheck.  Please review and refill if appropriate.  Charyl Bigger, CMA

## 2018-05-05 NOTE — Telephone Encounter (Signed)
MyChart message sent to pt.  T. Nelson, CMA 

## 2018-05-11 NOTE — Progress Notes (Signed)
Subjective:    Patient ID: Abigail Smith, female    DOB: 11-08-1980, 37 y.o.   MRN: [id number]  HPI:  01/19/18 OV: [name] is here to establish as a new pt.  She is a pleasant 37 year old female.  PMH: ADHD, Anxiety, Insomnia, and infertility (followed by OB/GYN). She has been on Adderall 20mg  BID >7 years and denies SE.  She reports sig improvement in focus/concentration when using Adderall. 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 She has trailed Trazodone in past and reports "It would make me woozy but not sleepy".  She has been on Zolpidem in past and reports that it worked well. She has long standing hx of GAD and has been in/out of therapy for years and is open to re-establishing with a new therapist. She estimates to drink > 100 oz water/day and follows heart healthy diet. Due to work schedule it is difficult to find time to exercise, so she performs home calisthenics twice a week. She denies tobacco use and will occasionally have wine. She is married and has four dogs.  They are not actively pursuing becoming pregnant currently.  05/12/18 OV: [name] presents for f/u on ADHD She reports good focus and concentration on Adderall 20mg  BID She has increased exercise- cardio and wt lifting most days of the week and reduced saturated fat in her diet. She has lost >5 lbs since last OV 01/2018 She estimates to drink >gallon water/day She denies tobacco use She enjoys alcohol a few nights of the week She reports improved sleep and infrequently taking Zolpidem.  Patient Care Team    Relationship Specialty Notifications Start End  Mina Marble D, NP PCP - General Family Medicine  12/15/17   Osborne Oman, MD Attending Physician Obstetrics and Gynecology  01/19/18     Patient Active Problem List   Diagnosis Date Noted  . Supervision of high risk pregnancy,  antepartum 02/17/2018  . AMA (advanced maternal age) primigravida 75+, first trimester 02/17/2018  . Missed abortion with fetal demise before 97 completed weeks of gestation 02/17/2018  . Healthcare maintenance 01/19/2018  . ADHD 01/19/2018  . Insomnia 01/19/2018     Past Medical History:  Diagnosis Date  . ADHD   . Anemia      Past Surgical History:  Procedure Laterality Date  . BREAST REDUCTION SURGERY  2018  . DILATION AND CURETTAGE OF UTERUS     EAB     Family History  Problem Relation Age of Onset  . Hypertension Mother   . Heart disease Brother   . Cancer Maternal Grandmother        lung  . Macular degeneration Maternal Grandmother   . Cancer Paternal Grandmother        stomach  . Alcohol abuse Paternal Grandfather   . Cancer Paternal Grandfather        esophageal     Social History   Substance and Sexual Activity  Drug Use No     Social History   Substance and Sexual Activity  Alcohol Use Not Currently  . Alcohol/week: 3.0 - 4.0 standard drinks  . Types: 3 - 4 Glasses of wine per week     Social History   Tobacco Use  Smoking Status Never Smoker  Smokeless Tobacco Never Used     Outpatient Encounter Medications as of 05/12/2018  Medication Sig  .  magnesium oxide (MAG-OX) 400 MG tablet Take 400 mg by mouth daily.  . Prenatal Vit-Fe Fumarate-FA (PRENATAL VITAMIN PO) Take 1 tablet by mouth daily.  Marland Kitchen zolpidem (AMBIEN CR) 12.5 MG CR tablet Take 1 tablet (12.5 mg total) by mouth at bedtime.  Marland Kitchen amphetamine-dextroamphetamine (ADDERALL) 20 MG tablet Take 1 tablet (20 mg total) by mouth 2 (two) times daily.  . [DISCONTINUED] acetaminophen-codeine (TYLENOL #3) 300-30 MG tablet Take 1-2 tablets by mouth every 6 (six) hours as needed for moderate pain.  . [DISCONTINUED] amphetamine-dextroamphetamine (ADDERALL) 20 MG tablet Take 1 tablet (20 mg total) by mouth 2 (two) times daily. (Patient not taking: Reported on 05/12/2018)  . [DISCONTINUED] ibuprofen  (ADVIL,MOTRIN) 600 MG tablet Take 1 tablet (600 mg total) by mouth every 6 (six) hours as needed.  . [DISCONTINUED] ibuprofen (ADVIL,MOTRIN) 800 MG tablet Take 1 tablet (800 mg total) by mouth 3 (three) times daily with meals as needed for headache or moderate pain. (Patient not taking: Reported on 02/17/2018)  . [DISCONTINUED] misoprostol (CYTOTEC) 200 MCG tablet Place 4 tablets in the vagina.  . [DISCONTINUED] promethazine (PHENERGAN) 25 MG tablet Take 1 tablet (25 mg total) by mouth every 6 (six) hours as needed for nausea or vomiting.   No facility-administered encounter medications on file as of 05/12/2018.     Allergies: Vicodin [hydrocodone-acetaminophen] and Vancomycin  Body mass index is 24.28 kg/m.  Blood pressure 110/73, pulse 84, height 5' 6.5" (1.689 m), weight 152 lb 11.2 oz (69.3 kg), last menstrual period 05/12/2018, SpO2 99 %, not currently breastfeeding.  Review of Systems  Constitutional: Positive for fatigue. Negative for activity change, appetite change, chills, diaphoresis, fever and unexpected weight change.  Eyes: Negative for visual disturbance.  Respiratory: Negative for cough, chest tightness, shortness of breath, wheezing and stridor.   Cardiovascular: Negative for chest pain, palpitations and leg swelling.  Gastrointestinal: Negative for abdominal distention, abdominal pain, blood in stool, constipation, diarrhea, nausea and vomiting.  Endocrine: Negative for cold intolerance, heat intolerance, polydipsia, polyphagia and polyuria.  Genitourinary: Negative for difficulty urinating and flank pain.  Musculoskeletal: Negative for arthralgias, back pain, gait problem, joint swelling, myalgias, neck pain and neck stiffness.  Skin: Negative for color change, pallor, rash and wound.  Neurological: Negative for headaches.  Hematological: Does not bruise/bleed easily.  Psychiatric/Behavioral: Positive for decreased concentration and sleep disturbance. Negative for  agitation, behavioral problems, confusion, dysphoric mood, hallucinations, self-injury and suicidal ideas. The patient is nervous/anxious and is hyperactive.       Objective:   Physical Exam  Constitutional: She is oriented to person, place, and time. She appears well-developed and well-nourished. No distress.  HENT:  Head: Normocephalic and atraumatic.  Right Ear: External ear normal.  Left Ear: External ear normal.  Mouth/Throat: Oropharynx is clear and moist.  Eyes: Pupils are equal, round, and reactive to light. Conjunctivae and EOM are normal.  Cardiovascular: Normal rate, regular rhythm, normal heart sounds and intact distal pulses. Exam reveals no gallop and no friction rub.  No murmur heard. Pulmonary/Chest: Effort normal and breath sounds normal. No stridor. No respiratory distress. She has no wheezes. She has no rales. She exhibits no tenderness.  Neurological: She is alert and oriented to person, place, and time.  Skin: Skin is warm and dry. Capillary refill takes less than 2 seconds. No rash noted. She is not diaphoretic. No erythema.  Psychiatric: She has a normal mood and affect. Her behavior is normal. Judgment and thought content normal.  Nursing note and vitals reviewed.  Assessment & Plan:   1. Healthcare maintenance   2. Attention deficit hyperactivity disorder (ADHD), unspecified ADHD type     ADHD [facility] Controlled Substance Database reviewed- no aberrancies noted. Adderall 20mg  BID refilled, okay for two more refills prior to next OV Good sx control, no SE  Healthcare maintenance Continue all medications as directed. Continue your excellent water intake and follow Mediterranean diet. Continue regular exercise- your blood pressure and weight are great! We will call you when lab results are available. Follow-up 3 months for complete physical.    FOLLOW-UP:  Return in about 3 months (around 08/11/2018) for CPE.

## 2018-05-12 ENCOUNTER — Ambulatory Visit (INDEPENDENT_AMBULATORY_CARE_PROVIDER_SITE_OTHER): Payer: No Typology Code available for payment source | Admitting: Adult Health

## 2018-05-12 ENCOUNTER — Encounter: Payer: Self-pay | Admitting: Adult Health

## 2018-05-12 VITALS — BP 110/73 | HR 84 | Ht 66.5 in | Wt 152.7 lb

## 2018-05-12 DIAGNOSIS — F909 Attention-deficit hyperactivity disorder, unspecified type: Secondary | ICD-10-CM | POA: Diagnosis not present

## 2018-05-12 DIAGNOSIS — Z0000 Encounter for general adult medical examination without abnormal findings: Secondary | ICD-10-CM | POA: Diagnosis not present

## 2018-05-12 MED ORDER — AMPHETAMINE-DEXTROAMPHETAMINE 20 MG PO TABS: 20.0000 mg | ORAL_TABLET | Freq: Two times a day (BID) | ORAL | 0 refills | Status: DC

## 2018-05-12 MED FILL — DEXTROAMP-AMPHETAMIN 20 MG: 20 | 30 days supply | Qty: 60 | Fill #0

## 2018-05-12 NOTE — Assessment & Plan Note (Signed)
[place] Controlled Substance Database reviewed- no aberrancies noted. Adderall 20mg  BID refilled, okay for two more refills prior to next OV Good sx control, no SE

## 2018-05-12 NOTE — Patient Instructions (Signed)
Mediterranean Diet A Mediterranean diet refers to food and lifestyle choices that are based on the traditions of countries located on the [place]. This way of eating has been shown to help prevent certain conditions and improve outcomes for people who have chronic diseases, like kidney disease and heart disease. What are tips for following this plan? Lifestyle  Cook and eat meals together with your family, when possible.  Drink enough fluid to keep your urine clear or pale yellow.  Be physically active every day. This includes: ? Aerobic exercise like running or swimming. ? Leisure activities like gardening, walking, or housework.  Get 7-8 hours of sleep each night.  If recommended by your health care provider, drink red wine in moderation. This means 1 glass a day for nonpregnant women and 2 glasses a day for men. A glass of wine equals 5 oz (150 mL). Reading food labels  Check the serving size of packaged foods. For foods such as rice and pasta, the serving size refers to the amount of cooked product, not dry.  Check the total fat in packaged foods. Avoid foods that have saturated fat or trans fats.  Check the ingredients list for added sugars, such as corn syrup. Shopping  At the grocery store, buy most of your food from the areas near the walls of the store. This includes: ? Fresh fruits and vegetables (produce). ? Grains, beans, nuts, and seeds. Some of these may be available in unpackaged forms or large amounts (in bulk). ? Fresh seafood. ? Poultry and eggs. ? Low-fat dairy products.  Buy whole ingredients instead of prepackaged foods.  Buy fresh fruits and vegetables in-season from local farmers markets.  Buy frozen fruits and vegetables in resealable bags.  If you do not have access to quality fresh seafood, buy precooked frozen shrimp or canned fish, such as tuna, salmon, or sardines.  Buy small amounts of raw or cooked vegetables, salads, or olives from the  deli or salad bar at your store.  Stock your pantry so you always have certain foods on hand, such as olive oil, canned tuna, canned tomatoes, rice, pasta, and beans. Cooking  Cook foods with extra-virgin olive oil instead of using butter or other vegetable oils.  Have meat as a side dish, and have vegetables or grains as your main dish. This means having meat in small portions or adding small amounts of meat to foods like pasta or stew.  Use beans or vegetables instead of meat in common dishes like chili or lasagna.  Experiment with different cooking methods. Try roasting or broiling vegetables instead of steaming or sauteing them.  Add frozen vegetables to soups, stews, pasta, or rice.  Add nuts or seeds for added healthy fat at each meal. You can add these to yogurt, salads, or vegetable dishes.  Marinate fish or vegetables using olive oil, lemon juice, garlic, and fresh herbs. Meal planning  Plan to eat 1 vegetarian meal one day each week. Try to work up to 2 vegetarian meals, if possible.  Eat seafood 2 or more times a week.  Have healthy snacks readily available, such as: ? Vegetable sticks with hummus. ? Greek yogurt. ? Fruit and nut trail mix.  Eat balanced meals throughout the week. This includes: ? Fruit: 2-3 servings a day ? Vegetables: 4-5 servings a day ? Low-fat dairy: 2 servings a day ? Fish, poultry, or lean meat: 1 serving a day ? Beans and legumes: 2 or more servings a week ? Nuts   and seeds: 1-2 servings a day ? Whole grains: 6-8 servings a day ? Extra-virgin olive oil: 3-4 servings a day  Limit red meat and sweets to only a few servings a month What are my food choices?  Mediterranean diet ? Recommended ? Grains: Whole-grain pasta. Brown rice. Bulgar wheat. Polenta. Couscous. Whole-wheat bread. [place]. ? Vegetables: Artichokes. Beets. Broccoli. Cabbage. Carrots. Eggplant. Green beans. Chard. Kale. Spinach. Onions. Leeks. Peas. Squash.  Tomatoes. Peppers. Radishes. ? Fruits: Apples. Apricots. Avocado. Berries. Bananas. Cherries. Dates. Figs. Grapes. Lemons. Melon. Oranges. Peaches. Plums. Pomegranate. ? Meats and other protein foods: Beans. Almonds. Sunflower seeds. Pine nuts. Peanuts. [place]. Salmon. Scallops. Shrimp. [place]. Tilapia. Clams. Oysters. Eggs. ? Dairy: Low-fat milk. Cheese. Greek yogurt. ? Beverages: Water. Red wine. Herbal tea. ? Fats and oils: Extra virgin olive oil. Avocado oil. Grape seed oil. ? Sweets and desserts: [place] yogurt with honey. Baked apples. Poached pears. Trail mix. ? Seasoning and other foods: Basil. Cilantro. Coriander. Cumin. Mint. Parsley. Sage. Rosemary. Tarragon. Garlic. Oregano. Thyme. Pepper. Balsalmic vinegar. Tahini. Hummus. Tomato sauce. Olives. Mushrooms. ? Limit these ? Grains: Prepackaged pasta or rice dishes. Prepackaged cereal with added sugar. ? Vegetables: Deep fried potatoes (french fries). ? Fruits: Fruit canned in syrup. ? Meats and other protein foods: Beef. Pork. Lamb. Poultry with skin. Hot dogs. [place] Salines. ? Dairy: Ice cream. Sour cream. Whole milk. ? Beverages: Juice. Sugar-sweetened soft drinks. Beer. Liquor and spirits. ? Fats and oils: Butter. Canola oil. Vegetable oil. Beef fat (tallow). Lard. ? Sweets and desserts: Cookies. Cakes. Pies. Candy. ? Seasoning and other foods: Mayonnaise. Premade sauces and marinades. ? The items listed may not be a complete list. Talk with your dietitian about what dietary choices are right for you. Summary  The Mediterranean diet includes both food and lifestyle choices.  Eat a variety of fresh fruits and vegetables, beans, nuts, seeds, and whole grains.  Limit the amount of red meat and sweets that you eat.  Talk with your health care provider about whether it is safe for you to drink red wine in moderation. This means 1 glass a day for nonpregnant women and 2 glasses a day for men. A glass of wine equals 5 oz (150 mL). This information  is not intended to replace advice given to you by your health care provider. Make sure you discuss any questions you have with your health care provider. Document Released: 04/16/2016 Document Revised: 05/19/2016 Document Reviewed: 04/16/2016 Elsevier Interactive Patient Education  2018 [place] With Attention Deficit Hyperactivity Disorder If you have been diagnosed with attention deficit hyperactivity disorder (ADHD), you may be relieved that you now know why you have felt or behaved a certain way. Still, you may feel overwhelmed about the treatment ahead. You may also wonder how to get the support you need and how to deal with the condition day-to-day. With treatment and support, you can live with ADHD and manage your symptoms. How to manage lifestyle changes Managing stress Stress is your body's reaction to life changes and events, both good and bad. To cope with the stress of an ADHD diagnosis, it may help to:  Learn more about ADHD.  Exercise regularly. Even a short daily walk can lower stress levels.  Participate in training or education programs (including social skills training classes) that teach you to deal with symptoms.  Medicines Your health care provider may suggest certain medicines if he or she feels that they will help to improve your condition. Stimulant  medicines are usually prescribed to treat ADHD, and therapy may also be prescribed. It is important to:  Avoid using alcohol and other substances that may prevent your medicines from working properly [facility]).  Talk with your pharmacist or health care provider about all the medicines that you take, their possible side effects, and what medicines are safe to take together.  Make it your goal to take part in all treatment decisions (shared decision-making). Ask about possible side effects of medicines that your health care provider recommends, and tell him or her how you feel about having those side  effects. It is best if shared decision-making with your health care provider is part of your total treatment plan.  Relationships To strengthen your relationships with family members while treating your condition, consider taking part in family therapy. You might also attend self-help groups alone or with a loved one. Be honest about how your symptoms affect your relationships. Make an effort to communicate respectfully instead of fighting, and find ways to show others that you care. Psychotherapy may be useful in helping you cope with how ADHD affects your relationships. How to recognize changes in your condition The following signs may mean that your treatment is working well and your condition is improving:  Consistently being on time for appointments.  Being more organized at home and work.  Other people noticing improvements in your behavior.  Achieving goals that you set for yourself.  Thinking more clearly.  The following signs may mean that your treatment is not working very well:  Feeling impatience or more confusion.  Missing, forgetting, or being late for appointments.  An increasing sense of disorganization and messiness.  More difficulty in reaching goals that you set for yourself.  Loved ones becoming angry or frustrated with you.  Where to find support Talking to others  Keep emotion out of important discussions and speak in a calm, logical way.  Listen closely and patiently to your loved ones. Try to understand their point of view, and try to avoid getting defensive.  Take responsibility for the consequences of your actions.  Ask that others do not take your behaviors personally.  Aim to solve problems as they come up, and express your feelings instead of bottling them up.  Talk openly about what you need from your loved ones and how they can support you.  Consider going to family therapy sessions or having your family meet with a specialist who deals with  ADHD-related behavior problems. Finances Not all insurance plans cover mental health care, so it is important to check with your insurance carrier. If paying for co-pays or counseling services is a problem, search for a local or county mental health care center. Public mental health care services may be offered there at a low cost or no cost when you are not able to see a private health care provider. If you are taking medicine for ADHD, you may be able to get the generic form, which may be less expensive than brand-name medicine. Some makers of prescription medicines also offer help to patients who cannot afford the medicines that they need. Follow these instructions at home:  Take over-the-counter and prescription medicines only as told by your health care provider. Check with your health care provider before taking any new medicines.  Create structure and an organized atmosphere at home. For example: ? Make a list of tasks, then rank them from most important to least important. Work on one task at a time until your  listed tasks are done. ? Make a daily schedule and follow it consistently every day. ? Use an appointment calendar, and check it 2 or 3 times a day to keep on track. Keep it with you when you leave the house. ? Create spaces where you keep certain things, and always put things back in their places after you use them.  Keep all follow-up visits as told by your health care provider. This is important. Questions to ask your health care provider:  What are the risks and benefits of taking medicines?  Would I benefit from therapy?  How often should I follow up with a health care provider? Contact a health care provider if:  You have side effects from your medicines, such as: ? Repeated muscle twitches, coughing, or speech outbursts. ? Sleep problems. ? Loss of appetite. ? Depression. ? New or worsening behavior problems. ? Dizziness. ? Unusually fast heartbeat. ? Stomach  pains. ? Headaches. Get help right away if:  You have a severe reaction to a medicine.  Your behavior suddenly gets worse. Summary  With treatment and support, you can live with ADHD and manage your symptoms.  The medicines that are most often prescribed for ADHD are stimulants.  Consider taking part in family therapy or self-help groups with family members or friends.  When you talk with friends and family about your ADHD, be patient and communicate openly.  Take over-the-counter and prescription medicines only as told by your health care provider. Check with your health care provider before taking any new medicines. This information is not intended to replace advice given to you by your health care provider. Make sure you discuss any questions you have with your health care provider. Document Released: 12/24/2016 Document Revised: 12/24/2016 Document Reviewed: 12/24/2016 Elsevier Interactive Patient Education  2018 [place] all medications as directed. Continue your excellent water intake and follow Mediterranean diet. Continue regular exercise- your blood pressure and weight are great! We will call you when lab results are available. Follow-up 3 months for complete physical. NICE TO SEE YOU!

## 2018-05-12 NOTE — Assessment & Plan Note (Signed)
Continue all medications as directed. Continue your excellent water intake and follow Mediterranean diet. Continue regular exercise- your blood pressure and weight are great! We will call you when lab results are available. Follow-up 3 months for complete physical.

## 2018-05-13 LAB — CBC WITH DIFFERENTIAL/PLATELET
Basophils Absolute: 0 10*3/uL (ref 0.0–0.2)
Basos: 1 %
EOS (ABSOLUTE): 0.1 10*3/uL (ref 0.0–0.4)
Eos: 1 %
Hematocrit: 39.9 % (ref 34.0–46.6)
Hemoglobin: 13.7 g/dL (ref 11.1–15.9)
IMMATURE GRANULOCYTES: 0 %
Immature Grans (Abs): 0 10*3/uL (ref 0.0–0.1)
Lymphocytes Absolute: 1 10*3/uL (ref 0.7–3.1)
Lymphs: 14 %
MCH: 33.3 pg — ABNORMAL HIGH (ref 26.6–33.0)
MCHC: 34.3 g/dL (ref 31.5–35.7)
MCV: 97 fL (ref 79–97)
MONOS ABS: 0.5 10*3/uL (ref 0.1–0.9)
Monocytes: 8 %
NEUTROS PCT: 76 %
Neutrophils Absolute: 5 10*3/uL (ref 1.4–7.0)
Platelets: 330 10*3/uL (ref 150–450)
RBC: 4.11 x10E6/uL (ref 3.77–5.28)
RDW: 12.2 % — AB (ref 12.3–15.4)
WBC: 6.7 10*3/uL (ref 3.4–10.8)

## 2018-05-13 LAB — COMPREHENSIVE METABOLIC PANEL
A/G RATIO: 2 (ref 1.2–2.2)
ALT: 14 IU/L (ref 0–32)
AST: 13 IU/L (ref 0–40)
Albumin: 4.5 g/dL (ref 3.5–5.5)
Alkaline Phosphatase: 29 IU/L — ABNORMAL LOW (ref 39–117)
BUN/Creatinine Ratio: 22 (ref 9–23)
BUN: 15 mg/dL (ref 6–20)
Bilirubin Total: 0.4 mg/dL (ref 0.0–1.2)
CALCIUM: 9.6 mg/dL (ref 8.7–10.2)
CO2: 23 mmol/L (ref 20–29)
Chloride: 104 mmol/L (ref 96–106)
Creatinine, Ser: 0.67 mg/dL (ref 0.57–1.00)
GFR calc Af Amer: 130 mL/min/{1.73_m2} (ref >59)
GFR, EST NON AFRICAN AMERICAN: 113 mL/min/{1.73_m2} (ref >59)
GLOBULIN, TOTAL: 2.2 g/dL (ref 1.5–4.5)
Glucose: 107 mg/dL — ABNORMAL HIGH (ref 65–99)
POTASSIUM: 4.1 mmol/L (ref 3.5–5.2)
SODIUM: 140 mmol/L (ref 134–144)
Total Protein: 6.7 g/dL (ref 6.0–8.5)

## 2018-05-13 LAB — LIPID PANEL
Chol/HDL Ratio: 2.7 ratio (ref 0.0–4.4)
Cholesterol, Total: 185 mg/dL (ref 100–199)
HDL: 69 mg/dL (ref >39)
LDL Calculated: 107 mg/dL — ABNORMAL HIGH (ref 0–99)
Triglycerides: 47 mg/dL (ref 0–149)
VLDL CHOLESTEROL CAL: 9 mg/dL (ref 5–40)

## 2018-05-13 LAB — HEMOGLOBIN A1C
ESTIMATED AVERAGE GLUCOSE: 100 mg/dL
HEMOGLOBIN A1C: 5.1 % (ref 4.8–5.6)

## 2018-05-13 LAB — TSH: TSH: 1.05 u[IU]/mL (ref 0.450–4.500)

## 2018-06-16 ENCOUNTER — Other Ambulatory Visit: Payer: Self-pay | Admitting: Adult Health

## 2018-06-16 MED ORDER — AMPHETAMINE-DEXTROAMPHETAMINE 20 MG PO TABS: 20.0000 mg | ORAL_TABLET | Freq: Two times a day (BID) | ORAL | 0 refills | Status: DC

## 2018-06-16 MED ORDER — ZOLPIDEM TARTRATE ER 12.5 MG PO TBCR: 12.5000 mg | EXTENDED_RELEASE_TABLET | Freq: Every day | ORAL | 0 refills | Status: DC

## 2018-06-16 MED FILL — ZOLPIDEM TART ER 12.5 MG TA: 12.5 | 30 days supply | Qty: 30 | Fill #0

## 2018-06-16 MED FILL — DEXTROAMP-AMPHETAMIN 20 MG: 20 | 30 days supply | Qty: 60 | Fill #0

## 2018-07-14 ENCOUNTER — Other Ambulatory Visit: Payer: Self-pay | Admitting: Adult Health

## 2018-07-18 MED ORDER — AMPHETAMINE-DEXTROAMPHETAMINE 20 MG PO TABS: 20.0000 mg | ORAL_TABLET | Freq: Two times a day (BID) | ORAL | 0 refills | Status: DC

## 2018-07-18 MED ORDER — ZOLPIDEM TARTRATE ER 12.5 MG PO TBCR: 12.5000 mg | EXTENDED_RELEASE_TABLET | Freq: Every day | ORAL | 0 refills | Status: DC

## 2018-07-18 MED FILL — DEXTROAMP-AMPHETAMIN 20 MG: 20 | 30 days supply | Qty: 60 | Fill #0

## 2018-07-18 MED FILL — ZOLPIDEM TART ER 12.5 MG TA: 12.5 | 30 days supply | Qty: 30 | Fill #0

## 2018-08-14 NOTE — Progress Notes (Deleted)
   Subjective:    Patient ID: Abigail Smith, female    DOB: 1981/06/12, 37 y.o.   MRN: [id number]  HPI: Ms. Abigail Smith presents for CPE  Healthcare Maintenance: PAP- Mammogram- Immunizations-  Patient Care Team    Relationship Specialty Notifications Start End  Abigail Grandchild, NP PCP - General Family Medicine  12/15/17   Osborne Oman, MD Attending Physician Obstetrics and Gynecology  01/19/18     Patient Active Problem List   Diagnosis Date Noted  . Supervision of high risk pregnancy, antepartum 02/17/2018  . AMA (advanced maternal age) primigravida 5+, first trimester 02/17/2018  . Missed abortion with fetal demise before 55 completed weeks of gestation 02/17/2018  . Healthcare maintenance 01/19/2018  . ADHD 01/19/2018  . Insomnia 01/19/2018     Past Medical History:  Diagnosis Date  . ADHD   . Anemia      Past Surgical History:  Procedure Laterality Date  . BREAST REDUCTION SURGERY  2018  . DILATION AND CURETTAGE OF UTERUS     EAB     Family History  Problem Relation Age of Onset  . Hypertension Mother   . Heart disease Brother   . Cancer Maternal Grandmother        lung  . Macular degeneration Maternal Grandmother   . Cancer Paternal Grandmother        stomach  . Alcohol abuse Paternal Grandfather   . Cancer Paternal Grandfather        esophageal     Social History   Substance and Sexual Activity  Drug Use No     Social History   Substance and Sexual Activity  Alcohol Use Not Currently  . Alcohol/week: 3.0 - 4.0 standard drinks  . Types: 3 - 4 Glasses of wine per week     Social History   Tobacco Use  Smoking Status Never Smoker  Smokeless Tobacco Never Used     Outpatient Encounter Medications as of 08/15/2018  Medication Sig  . amphetamine-dextroamphetamine (ADDERALL) 20 MG tablet Take 1 tablet (20 mg total) by mouth 2 (two) times daily.  . magnesium oxide (MAG-OX) 400 MG tablet Take 400 mg by mouth daily.  . Prenatal Vit-Fe  Fumarate-FA (PRENATAL VITAMIN PO) Take 1 tablet by mouth daily.  Marland Kitchen zolpidem (AMBIEN CR) 12.5 MG CR tablet Take 1 tablet (12.5 mg total) by mouth at bedtime.   No facility-administered encounter medications on file as of 08/15/2018.     Allergies: Vicodin [hydrocodone-acetaminophen] and Vancomycin  There is no height or weight on file to calculate BMI.  Last menstrual period 12/14/2017.     Review of Systems     Objective:   Physical Exam        Assessment & Plan:  No diagnosis found.  No problem-specific Assessment & Plan notes found for this encounter.    FOLLOW-UP:  No follow-ups on file.

## 2018-08-15 ENCOUNTER — Encounter: Payer: Self-pay | Admitting: Adult Health

## 2018-10-04 NOTE — Progress Notes (Signed)
Subjective:    Patient ID: Abigail Smith, female    DOB: 1981/06/02, 38 y.o.   MRN: [id number]  HPI:  01/19/18 OV: Abigail Smith is here to establish as a new pt.  She is a pleasant 38 year old female.  PMH: ADHD, Anxiety, Insomnia, and infertility (followed by OB/GYN). She has been on Adderall 20mg  BID >7 years and denies SE.  She reports sig improvement in focus/concentration when using Adderall. 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 She has trailed Trazodone in past and reports "It would make me woozy but not sleepy".  She has been on Zolpidem in past and reports that it worked well. She has long standing hx of GAD and has been in/out of therapy for years and is open to re-establishing with a new therapist. She estimates to drink > 100 oz water/day and follows heart healthy diet. Due to work schedule it is difficult to find time to exercise, so she performs home calisthenics twice a week. She denies tobacco use and will occasionally have wine. She is married and has four dogs.  They are not actively pursuing becoming pregnant currently.  05/12/18 OV: Abigail Smith presents for f/u on ADHD She reports good focus and concentration on Adderall 20mg  BID She has increased exercise- cardio and wt lifting most days of the week and reduced saturated fat in her diet. She has lost >5 lbs since last OV 01/2018 She estimates to drink >gallon water/day She denies tobacco use She enjoys alcohol a few nights of the week She reports improved sleep and infrequently taking Zolpidem.  10/06/2018 OV: Abigail Smith presents for f/u: ADHD, insomnia She reports being on Adderall 20mg  >5 years and feels "that it isn't helping my focus very much".  She denies CP/palpitations She denies tobacco/vape use She has dramatically increase regular exercise: 5-6 times/week- cardio and weight training >60  mins She estimates to drink >80 oz water/day and follows heart healthy diet She socially consumes ETOH She reports using Zolpidem 12.5mg  3-4 times/week She also reports noticeable increase in generalized anxiety, r/t to separation from her husband. They have been living apart >6 months, however have not formally filed for divorce He has only attended one joint therapy session. She has been attending therapy weekly. She has previously been on Sertraline in past, tolerated well and would like to re-start. She denies thoughts of harming herself/others She reports good support system of friends/family, however she has not told her family about the separation- ?? She reports that her husband has been taking out small sum loans (he is in [employer] school) and "wasting money, on who knows what". She is concerned that he "will tank his career before it really gets started". She cannot clearly answer if she wants a divorce or not Encouraged to continue to work with therapist and the tell her family what is really going in inside the marriage  Patient Care Team    Relationship Specialty Notifications Start End  Rutha Melgoza, Jinny Blossom, NP PCP - General Family Medicine  12/15/17   Tereso Newcomer, MD Attending Physician Obstetrics and Gynecology  01/19/18     Patient Active Problem List   Diagnosis Date Noted  . GAD (generalized anxiety disorder) 10/06/2018  . Supervision of high risk pregnancy, antepartum 02/17/2018  . AMA (advanced maternal age) primigravida 17+, first trimester 02/17/2018  . Missed abortion with fetal  demise before 20 completed weeks of gestation 02/17/2018  . Healthcare maintenance 01/19/2018  . ADHD 01/19/2018  . Insomnia 01/19/2018     Past Medical History:  Diagnosis Date  . ADHD   . Anemia      Past Surgical History:  Procedure Laterality Date  . BREAST REDUCTION SURGERY  2018  . DILATION AND CURETTAGE OF UTERUS     EAB     Family History  Problem Relation Age of  Onset  . Hypertension Mother   . Heart disease Brother   . Cancer Maternal Grandmother        lung  . Macular degeneration Maternal Grandmother   . Cancer Paternal Grandmother        stomach  . Alcohol abuse Paternal Grandfather   . Cancer Paternal Grandfather        esophageal     Social History   Substance and Sexual Activity  Drug Use No     Social History   Substance and Sexual Activity  Alcohol Use Not Currently  . Alcohol/week: 3.0 - 4.0 standard drinks  . Types: 3 - 4 Glasses of wine per week     Social History   Tobacco Use  Smoking Status Never Smoker  Smokeless Tobacco Never Used     Outpatient Encounter Medications as of 10/06/2018  Medication Sig  . amphetamine-dextroamphetamine (ADDERALL) 30 MG tablet Take 1 tablet by mouth 2 (two) times daily.  . magnesium oxide (MAG-OX) 400 MG tablet Take 400 mg by mouth daily.  . Prenatal Vit-Fe Fumarate-FA (PRENATAL VITAMIN PO) Take 1 tablet by mouth daily.  Marland Kitchen zolpidem (AMBIEN CR) 12.5 MG CR tablet Take 1 tablet (12.5 mg total) by mouth at bedtime.  . [DISCONTINUED] amphetamine-dextroamphetamine (ADDERALL) 20 MG tablet Take 1 tablet (20 mg total) by mouth 2 (two) times daily.  . [DISCONTINUED] zolpidem (AMBIEN CR) 12.5 MG CR tablet Take 1 tablet (12.5 mg total) by mouth at bedtime.  . sertraline (ZOLOFT) 50 MG tablet 1/2 tablet by mouth daily for one week, then increase to one full tablet daily   No facility-administered encounter medications on file as of 10/06/2018.     Allergies: Vicodin [hydrocodone-acetaminophen] and Vancomycin  Body mass index is 24.12 kg/m.  Blood pressure 136/80, pulse 86, temperature 97.7 F (36.5 C), temperature source Oral, height 5' 6.5" (1.689 m), weight 151 lb 11.2 oz (68.8 kg), last menstrual period 09/15/2018, SpO2 100 %.  Review of Systems  Constitutional: Positive for fatigue. Negative for activity change, appetite change, chills, diaphoresis, fever and unexpected weight  change.  Eyes: Negative for visual disturbance.  Respiratory: Negative for cough, chest tightness, shortness of breath, wheezing and stridor.   Cardiovascular: Negative for chest pain, palpitations and leg swelling.  Gastrointestinal: Negative for abdominal distention, abdominal pain, blood in stool, constipation, diarrhea, nausea and vomiting.  Endocrine: Negative for cold intolerance, heat intolerance, polydipsia, polyphagia and polyuria.  Genitourinary: Negative for difficulty urinating and flank pain.  Musculoskeletal: Negative for arthralgias, back pain, gait problem, joint swelling, myalgias, neck pain and neck stiffness.  Skin: Negative for color change, pallor, rash and wound.  Neurological: Negative for headaches.  Hematological: Does not bruise/bleed easily.  Psychiatric/Behavioral: Positive for decreased concentration and sleep disturbance. Negative for agitation, behavioral problems, confusion, dysphoric mood, hallucinations, self-injury and suicidal ideas. The patient is nervous/anxious and is hyperactive.       Objective:   Physical Exam Vitals signs and nursing note reviewed.  Constitutional:      General: She is  not in acute distress.    Appearance: She is well-developed. She is not diaphoretic.  HENT:     Head: Normocephalic and atraumatic.     Right Ear: External ear normal.     Left Ear: External ear normal.  Eyes:     Conjunctiva/sclera: Conjunctivae normal.     Pupils: Pupils are equal, round, and reactive to light.  Cardiovascular:     Rate and Rhythm: Normal rate and regular rhythm.     Heart sounds: Normal heart sounds. No murmur. No friction rub. No gallop.   Pulmonary:     Effort: Pulmonary effort is normal. No respiratory distress.     Breath sounds: Normal breath sounds. No stridor. No wheezing or rales.  Chest:     Chest wall: No tenderness.  Skin:    General: Skin is warm and dry.     Capillary Refill: Capillary refill takes less than 2 seconds.      Findings: No erythema or rash.  Neurological:     Mental Status: She is alert and oriented to person, place, and time.  Psychiatric:        Behavior: Behavior normal.        Thought Content: Thought content normal.        Judgment: Judgment normal.       Assessment & Plan:   1. GAD (generalized anxiety disorder)     GAD (generalized anxiety disorder) Please take all medications as directed. Please follow taper on Sertraline and continue with therapist as directed. Limit alcohol and continue healthy eating and regular exercise. Follow-up in 4 weeks.  Pt was in the office today for 40+ minutes, I spent >75%  Of time in face to face counseling of various medical concerns and in coordination of care  FOLLOW-UP:  Return in about 4 weeks (around 11/03/2018) for Evaluate Medication Effectiveness, General Anxiety Disorder.

## 2018-10-06 ENCOUNTER — Ambulatory Visit (INDEPENDENT_AMBULATORY_CARE_PROVIDER_SITE_OTHER): Payer: No Typology Code available for payment source | Admitting: Adult Health

## 2018-10-06 ENCOUNTER — Encounter: Payer: Self-pay | Admitting: Adult Health

## 2018-10-06 DIAGNOSIS — G4700 Insomnia, unspecified: Secondary | ICD-10-CM | POA: Diagnosis not present

## 2018-10-06 DIAGNOSIS — F909 Attention-deficit hyperactivity disorder, unspecified type: Secondary | ICD-10-CM

## 2018-10-06 DIAGNOSIS — F411 Generalized anxiety disorder: Secondary | ICD-10-CM

## 2018-10-06 MED ORDER — ZOLPIDEM TARTRATE ER 12.5 MG PO TBCR: 12.5000 mg | EXTENDED_RELEASE_TABLET | Freq: Every day | ORAL | 0 refills | Status: DC

## 2018-10-06 MED ORDER — SERTRALINE HCL 50 MG PO TABS: ORAL_TABLET | ORAL | 0 refills | Status: DC

## 2018-10-06 MED ORDER — AMPHETAMINE-DEXTROAMPHETAMINE 30 MG PO TABS: 30.0000 mg | ORAL_TABLET | Freq: Two times a day (BID) | ORAL | 0 refills | Status: DC

## 2018-10-06 MED FILL — SERTRALINE HCL 50 MG TABLET: 50 | 30 days supply | Qty: 30 | Fill #0

## 2018-10-06 MED FILL — ZOLPIDEM TART ER 12.5 MG TA: 12.5 | 30 days supply | Qty: 30 | Fill #0

## 2018-10-06 MED FILL — AMPHETAMINE SALTS 30 MG TAB: 30 | 30 days supply | Qty: 60 | Fill #0

## 2018-10-06 NOTE — Assessment & Plan Note (Signed)
[place] Controlled Substance Database reviewed- no aberrancies noted Provided ADHD Self Report Questionaire- please complete and return at f/u Increased Adderall from 20mg  to 30mg  BID Continue regular exercise

## 2018-10-06 NOTE — Assessment & Plan Note (Signed)
Please take all medications as directed. Please follow taper on Sertraline and continue with therapist as directed. Limit alcohol and continue healthy eating and regular exercise. Follow-up in 4 weeks.

## 2018-10-06 NOTE — Patient Instructions (Signed)
Living With Attention Deficit Hyperactivity Disorder If you have been diagnosed with attention deficit hyperactivity disorder (ADHD), you may be relieved that you now know why you have felt or behaved a certain way. Still, you may feel overwhelmed about the treatment ahead. You may also wonder how to get the support you need and how to deal with the condition day-to-day. With treatment and support, you can live with ADHD and manage your symptoms. How to manage lifestyle changes Managing stress Stress is your body's reaction to life changes and events, both good and bad. To cope with the stress of an ADHD diagnosis, it may help to:  Learn more about ADHD.  Exercise regularly. Even a short daily walk can lower stress levels.  Participate in training or education programs (including social skills training classes) that teach you to deal with symptoms.  Medicines Your health care provider may suggest certain medicines if he or she feels that they will help to improve your condition. Stimulant medicines are usually prescribed to treat ADHD, and therapy may also be prescribed. It is important to:  Avoid using alcohol and other substances that may prevent your medicines from working properly [facility]).  Talk with your pharmacist or health care provider about all the medicines that you take, their possible side effects, and what medicines are safe to take together.  Make it your goal to take part in all treatment decisions (shared decision-making). Ask about possible side effects of medicines that your health care provider recommends, and tell him or her how you feel about having those side effects. It is best if shared decision-making with your health care provider is part of your total treatment plan. Relationships To strengthen your relationships with family members while treating your condition, consider taking part in family therapy. You might also attend self-help groups alone or with a  loved one. Be honest about how your symptoms affect your relationships. Make an effort to communicate respectfully instead of fighting, and find ways to show others that you care. Psychotherapy may be useful in helping you cope with how ADHD affects your relationships. How to recognize changes in your condition The following signs may mean that your treatment is working well and your condition is improving:  Consistently being on time for appointments.  Being more organized at home and work.  Other people noticing improvements in your behavior.  Achieving goals that you set for yourself.  Thinking more clearly. The following signs may mean that your treatment is not working very well:  Feeling impatience or more confusion.  Missing, forgetting, or being late for appointments.  An increasing sense of disorganization and messiness.  More difficulty in reaching goals that you set for yourself.  Loved ones becoming angry or frustrated with you. Where to find support Talking to others  Keep emotion out of important discussions and speak in a calm, logical way.  Listen closely and patiently to your loved ones. Try to understand their point of view, and try to avoid getting defensive.  Take responsibility for the consequences of your actions.  Ask that others do not take your behaviors personally.  Aim to solve problems as they come up, and express your feelings instead of bottling them up.  Talk openly about what you need from your loved ones and how they can support you.  Consider going to family therapy sessions or having your family meet with a specialist who deals with ADHD-related behavior problems. Finances Not all insurance plans cover mental health care,  so it is important to check with your insurance carrier. If paying for co-pays or counseling services is a problem, search for a local or county mental health care center. Public mental health care services may be offered  there at a low cost or no cost when you are not able to see a private health care provider. If you are taking medicine for ADHD, you may be able to get the generic form, which may be less expensive than brand-name medicine. Some makers of prescription medicines also offer help to patients who cannot afford the medicines that they need. Follow these instructions at home:  Take over-the-counter and prescription medicines only as told by your health care provider. Check with your health care provider before taking any new medicines.  Create structure and an organized atmosphere at home. For example: ? Make a list of tasks, then rank them from most important to least important. Work on one task at a time until your listed tasks are done. ? Make a daily schedule and follow it consistently every day. ? Use an appointment calendar, and check it 2 or 3 times a day to keep on track. Keep it with you when you leave the house. ? Create spaces where you keep certain things, and always put things back in their places after you use them.  Keep all follow-up visits as told by your health care provider. This is important. Questions to ask your health care provider:  What are the risks and benefits of taking medicines?  Would I benefit from therapy?  How often should I follow up with a health care provider? Contact a health care provider if:  You have side effects from your medicines, such as: ? Repeated muscle twitches, coughing, or speech outbursts. ? Sleep problems. ? Loss of appetite. ? Depression. ? New or worsening behavior problems. ? Dizziness. ? Unusually fast heartbeat. ? Stomach pains. ? Headaches. Get help right away if:  You have a severe reaction to a medicine.  Your behavior suddenly gets worse. Summary  With treatment and support, you can live with ADHD and manage your symptoms.  The medicines that are most often prescribed for ADHD are stimulants.  Consider taking part in  family therapy or self-help groups with family members or friends.  When you talk with friends and family about your ADHD, be patient and communicate openly.  Take over-the-counter and prescription medicines only as told by your health care provider. Check with your health care provider before taking any new medicines. This information is not intended to replace advice given to you by your health care provider. Make sure you discuss any questions you have with your health care provider. Document Released: 12/24/2016 Document Revised: 12/24/2016 Document Reviewed: 12/24/2016 Elsevier Interactive Patient Education  2019 [employer].   Generalized Anxiety Disorder, Adult Generalized anxiety disorder (GAD) is a mental health disorder. People with this condition constantly worry about everyday events. Unlike normal anxiety, worry related to GAD is not triggered by a specific event. These worries also do not fade or get better with time. GAD interferes with life functions, including relationships, work, and school. GAD can vary from mild to severe. People with severe GAD can have intense waves of anxiety with physical symptoms (panic attacks). What are the causes? The exact cause of GAD is not known. What increases the risk? This condition is more likely to develop in:  Women.  People who have a family history of anxiety disorders.  People who are very  shy.  People who experience very stressful life events, such as the death of a loved one.  People who have a very stressful family environment. What are the signs or symptoms? People with GAD often worry excessively about many things in their lives, such as their health and family. They may also be overly concerned about:  Doing well at work.  Being on time.  Natural disasters.  Friendships. Physical symptoms of GAD include:  Fatigue.  Muscle tension or having muscle twitches.  Trembling or feeling shaky.  Being easily  startled.  Feeling like your heart is pounding or racing.  Feeling out of breath or like you cannot take a deep breath.  Having trouble falling asleep or staying asleep.  Sweating.  Nausea, diarrhea, or irritable bowel syndrome (IBS).  Headaches.  Trouble concentrating or remembering facts.  Restlessness.  Irritability. How is this diagnosed? Your health care provider can diagnose GAD based on your symptoms and medical history. You will also have a physical exam. The health care provider will ask specific questions about your symptoms, including how severe they are, when they started, and if they come and go. Your health care provider may ask you about your use of alcohol or drugs, including prescription medicines. Your health care provider may refer you to a mental health specialist for further evaluation. Your health care provider will do a thorough examination and may perform additional tests to rule out other possible causes of your symptoms. To be diagnosed with GAD, a person must have anxiety that:  Is out of his or her control.  Affects several different aspects of his or her life, such as work and relationships.  Causes distress that makes him or her unable to take part in normal activities.  Includes at least three physical symptoms of GAD, such as restlessness, fatigue, trouble concentrating, irritability, muscle tension, or sleep problems. Before your health care provider can confirm a diagnosis of GAD, these symptoms must be present more days than they are not, and they must last for six months or longer. How is this treated? The following therapies are usually used to treat GAD:  Medicine. Antidepressant medicine is usually prescribed for long-term daily control. Antianxiety medicines may be added in severe cases, especially when panic attacks occur.  Talk therapy (psychotherapy). Certain types of talk therapy can be helpful in treating GAD by providing support,  education, and guidance. Options include: ? Cognitive behavioral therapy (CBT). People learn coping skills and techniques to ease their anxiety. They learn to identify unrealistic or negative thoughts and behaviors and to replace them with positive ones. ? Acceptance and commitment therapy (ACT). This treatment teaches people how to be mindful as a way to cope with unwanted thoughts and feelings. ? Biofeedback. This process trains you to manage your body's response (physiological response) through breathing techniques and relaxation methods. You will work with a therapist while machines are used to monitor your physical symptoms.  Stress management techniques. These include yoga, meditation, and exercise. A mental health specialist can help determine which treatment is best for you. Some people see improvement with one type of therapy. However, other people require a combination of therapies. Follow these instructions at home:  Take over-the-counter and prescription medicines only as told by your health care provider.  Try to maintain a normal routine.  Try to anticipate stressful situations and allow extra time to manage them.  Practice any stress management or self-calming techniques as taught by your health care provider.  Do  not punish yourself for setbacks or for not making progress.  Try to recognize your accomplishments, even if they are small.  Keep all follow-up visits as told by your health care provider. This is important. Contact a health care provider if:  Your symptoms do not get better.  Your symptoms get worse.  You have signs of depression, such as: ? A persistently sad, cranky, or irritable mood. ? Loss of enjoyment in activities that used to bring you joy. ? Change in weight or eating. ? Changes in sleeping habits. ? Avoiding friends or family members. ? Loss of energy for normal tasks. ? Feelings of guilt or worthlessness. Get help right away if:  You have  serious thoughts about hurting yourself or others. If you ever feel like you may hurt yourself or others, or have thoughts about taking your own life, get help right away. You can go to your nearest emergency department or call:  Your local emergency services (911 in the [place]  A suicide crisis helpline, such as the [facility] at (313)039-1441. This is open 24 hours a day. Summary  Generalized anxiety disorder (GAD) is a mental health disorder that involves worry that is not triggered by a specific event.  People with GAD often worry excessively about many things in their lives, such as their health and family.  GAD may cause physical symptoms such as restlessness, trouble concentrating, sleep problems, frequent sweating, nausea, diarrhea, headaches, and trembling or muscle twitching.  A mental health specialist can help determine which treatment is best for you. Some people see improvement with one type of therapy. However, other people require a combination of therapies. This information is not intended to replace advice given to you by your health care provider. Make sure you discuss any questions you have with your health care provider. Document Released: 12/19/2012 Document Revised: 07/14/2016 Document Reviewed: 07/14/2016 Elsevier Interactive Patient Education  [employer].  Please take all medications as directed. Please follow taper on Sertraline and continue with therapist as directed. Limit alcohol and continue healthy eating and regular exercise. Follow-up in 4 weeks. NICE TO SEE YOU!

## 2018-10-06 NOTE — Assessment & Plan Note (Signed)
[place] Controlled Substance Database reviewed- no aberrancies noted Continue to use Zolpidem sparingly - RF provided

## 2018-11-07 ENCOUNTER — Other Ambulatory Visit: Payer: Self-pay | Admitting: Adult Health

## 2018-11-09 MED ORDER — AMPHETAMINE-DEXTROAMPHETAMINE 30 MG PO TABS: 30.0000 mg | ORAL_TABLET | Freq: Two times a day (BID) | ORAL | 0 refills | Status: DC

## 2018-11-09 MED ORDER — ZOLPIDEM TARTRATE ER 12.5 MG PO TBCR: 12.5000 mg | EXTENDED_RELEASE_TABLET | Freq: Every day | ORAL | 0 refills | Status: DC

## 2018-11-09 MED ORDER — SERTRALINE HCL 50 MG PO TABS: ORAL_TABLET | ORAL | 0 refills | Status: DC

## 2018-11-09 NOTE — Telephone Encounter (Signed)
[place] Controlled Substance Database reviewed.  No aberrancies noted.  T. Latonga Ponder, CMA  

## 2018-12-01 ENCOUNTER — Other Ambulatory Visit: Payer: Self-pay | Admitting: Adult Health

## 2018-12-04 ENCOUNTER — Encounter: Payer: Self-pay | Admitting: Adult Health

## 2018-12-04 ENCOUNTER — Other Ambulatory Visit: Payer: Self-pay | Admitting: Adult Health

## 2018-12-05 MED ORDER — ZOLPIDEM TARTRATE ER 12.5 MG PO TBCR: 12.5000 mg | EXTENDED_RELEASE_TABLET | Freq: Every day | ORAL | 0 refills | Status: DC

## 2018-12-05 MED ORDER — SERTRALINE HCL 50 MG PO TABS: ORAL_TABLET | ORAL | 0 refills | Status: DC

## 2018-12-05 MED ORDER — AMPHETAMINE-DEXTROAMPHETAMINE 30 MG PO TABS: 30.0000 mg | ORAL_TABLET | Freq: Two times a day (BID) | ORAL | 0 refills | Status: DC

## 2019-01-05 ENCOUNTER — Other Ambulatory Visit: Payer: Self-pay | Admitting: Adult Health

## 2019-01-09 NOTE — Telephone Encounter (Signed)
LOV 10/06/2018  Medications were all last filled 12/05/2018 for 30 days.  Patient was to follow up in 4 weeks (11/03/2018)  Patient did not follow up - MyChart message on 12/04/2018 patient stated that she was tolerating meds still struggling with anxiety. Patient did not respond to rather she is seeing therapist.  Please review and advise if patient needs follow before any refills. MPulliam, CMA/RT(R)

## 2019-01-09 NOTE — Telephone Encounter (Signed)
Good Morning Baker Janus, Can you please this pt and have her schedule telemedicine appt, re: ADHD, Insomnia, GAD f/u She is about 2 months past due for f/u and before I can refill rx she needs to check Please put her on for tomorrow if able Wilson!

## 2019-01-14 NOTE — Progress Notes (Signed)
Virtual Visit via Telephone Note  I connected with Abigail Smith on 01/16/2019 at  1:45 PM EDT by telephone and verified that I am speaking with the correct person using two identifiers.  Location: Patient: Home Provider: In Clinic  I discussed the limitations, risks, security and privacy concerns of performing an evaluation and management service by telephone and the availability of in person appointments. I also discussed with the patient that there may be a patient responsible charge related to this service. The patient expressed understanding and agreed to proceed.   History of Present Illness: 10/06/2018 OV: Abigail Smith presents for f/u: ADHD, GAD She reports being on Adderall 20mg  >5 years and feels "that it isn't helping my focus very much".  She denies CP/palpitations She denies tobacco/vape use She has dramatically increase regular exercise: 5-6 times/week- cardio and weight training >60 mins She estimates to drink >80 oz water/day and follows heart healthy diet She socially consumes ETOH She reports using Zolpidem 12.5mg  3-4 times/week She also reports noticeable increase in generalized anxiety, r/t to separation from her husband. They have been living apart >6 months, however have not formally filed for divorce He has only attended one joint therapy session. She has been attending therapy weekly. She has previously been on Sertraline in past, tolerated well and would like to re-start. She denies thoughts of harming herself/others She reports good support system of friends/family, however she has not told her family about the separation- ?? She reports that her husband has been taking out small sum loans (he is in CRNA school) and "wasting money, on who knows what". She is concerned that he "will tank his career before it really gets started". She cannot clearly answer if she wants a divorce or not Encouraged to continue to work with therapist and the tell her family what is  really going in inside the marriage 01/16/2019 OV: Abigail Smith calls in today for f/u ADHD, depression She reports adequate focus/concentration on Adderall 30mg  BID She denies CP/dyspnea/dizziness/palpitations She reports less effective mood control with the sertraline 50mg  then when she initially started the Rx She denies SI/HI, but continues to experience depression She is working with her therapist once a week or once every two weeks vie TeleMedicine She continue to exercise regularly- yoga 6 days/week, walking 1-2 miles day She estimates to drink >80 oz water/day and follows heart healthy healthy diet She has reduced ETOH use- now only 3 nights week, 1-2 glasses wine those evenings She has left he job [employer], currently unemployed- she was quite vague as to 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 She reports that their families have been made aware of the separation. She reports needing the Zolpidem 12.5mg  nightly, denies over-sedation in the morning  Patient Care Team    Relationship Specialty Notifications Start End  Esaw Grandchild, NP PCP - General Family Medicine  12/15/17   Osborne Oman, MD Attending Physician Obstetrics and Gynecology  01/19/18     Patient Active Problem List   Diagnosis Date Noted  . GAD (generalized anxiety disorder) 10/06/2018  . Supervision of high risk pregnancy, antepartum 02/17/2018  . AMA (advanced maternal age) primigravida 25+, first trimester 02/17/2018  . Missed abortion with fetal demise before 50 completed weeks of gestation 02/17/2018  . Healthcare maintenance 01/19/2018  . ADHD 01/19/2018  . Insomnia 01/19/2018     Past Medical  History:  Diagnosis Date  . ADHD   . Anemia      Past Surgical History:  Procedure Laterality Date  . BREAST REDUCTION SURGERY  2018  . DILATION AND CURETTAGE OF UTERUS     EAB      Family History  Problem Relation Age of Onset  . Hypertension Mother   . Heart disease Brother   . Cancer Maternal Grandmother        lung  . Macular degeneration Maternal Grandmother   . Cancer Paternal Grandmother        stomach  . Alcohol abuse Paternal Grandfather   . Cancer Paternal Grandfather        esophageal     Social History   Substance and Sexual Activity  Drug Use No     Social History   Substance and Sexual Activity  Alcohol Use Not Currently  . Alcohol/week: 3.0 - 4.0 standard drinks  . Types: 3 - 4 Glasses of wine per week     Social History   Tobacco Use  Smoking Status Never Smoker  Smokeless Tobacco Never Used     Outpatient Encounter Medications as of 01/16/2019  Medication Sig  . amphetamine-dextroamphetamine (ADDERALL) 30 MG tablet Take 1 tablet by mouth 2 (two) times daily.  . magnesium oxide (MAG-OX) 400 MG tablet Take 400 mg by mouth daily.  . Prenatal Vit-Fe Fumarate-FA (PRENATAL VITAMIN PO) Take 1 tablet by mouth daily.  . sertraline (ZOLOFT) 100 MG tablet One full tablet daily  . zolpidem (AMBIEN CR) 12.5 MG CR tablet Take 1 tablet (12.5 mg total) by mouth at bedtime.  . [DISCONTINUED] sertraline (ZOLOFT) 50 MG tablet 1/2 tablet by mouth daily for one week, then increase to one full tablet daily   No facility-administered encounter medications on file as of 01/16/2019.     Allergies: Vicodin [hydrocodone-acetaminophen] and Vancomycin  Body mass index is 24.17 kg/m.  Blood pressure 132/81, pulse 90, temperature 98.9 F (37.2 C), temperature source Oral, weight 152 lb (68.9 kg).   Review of Systems: General:   Denies fever, chills, unexplained weight loss.  Optho/Auditory:   Denies visual changes, blurred vision/LOV Respiratory:   Denies SOB, DOE more than baseline levels.  Cardiovascular:   Denies chest pain, palpitations, new onset peripheral edema  Gastrointestinal:   Denies nausea, vomiting, diarrhea.   Genitourinary: Denies dysuria, freq/ urgency, flank pain or discharge from genitals.  Endocrine:     Denies hot or cold intolerance, polyuria, polydipsia. Musculoskeletal:   Denies unexplained myalgias, joint swelling, unexplained arthralgias, gait problems.  Skin:  Denies rash, suspicious lesions Neurological:     Denies dizziness, unexplained weakness, numbness  Psychiatric/Behavioral:   Denies mood changes, suicidal or homicidal ideations, hallucinations This patient does not have sx concerning for COVID-19 Infection (ie; fever, chills, cough, new or worsening shortness of breath).  Observations/Objective: No acute distress noted during the telephone conversation  Assessment and Plan: [facility] Controlled Substance Database reviewed- no aberrancies noted Continue current Adderall regime Increased Sertraline from 50mg  to 100mg  QD Continue with therapist Continue regular exercise Remain well hydrated and follow heart healthy diet  COVID-19 Education: Signs and symptoms of COVID-19 infection were discussed with pt and how to seek care for testing.  The importance of following the Stay at Home order, and when out- Social Distancing and wearing a facial mask were discussed today.  Follow Up Instructions: 3 month f/u with fasting labs   I discussed the assessment and treatment plan with the patient.  The patient was provided an opportunity to ask questions and all were answered. The patient agreed with the plan and demonstrated an understanding of the instructions.   The patient was advised to call back or seek an in-person evaluation if the symptoms worsen or if the condition fails to improve as anticipated.  I provided 14 minutes of non-face-to-face time during this encounter.   Esaw Grandchild, NP

## 2019-01-16 ENCOUNTER — Other Ambulatory Visit: Payer: Self-pay

## 2019-01-16 ENCOUNTER — Ambulatory Visit (INDEPENDENT_AMBULATORY_CARE_PROVIDER_SITE_OTHER): Payer: No Typology Code available for payment source | Admitting: Adult Health

## 2019-01-16 DIAGNOSIS — F909 Attention-deficit hyperactivity disorder, unspecified type: Secondary | ICD-10-CM

## 2019-01-16 DIAGNOSIS — F411 Generalized anxiety disorder: Secondary | ICD-10-CM | POA: Diagnosis not present

## 2019-01-16 MED ORDER — ZOLPIDEM TARTRATE ER 12.5 MG PO TBCR: 12.5000 mg | EXTENDED_RELEASE_TABLET | Freq: Every day | ORAL | 0 refills | Status: DC

## 2019-01-16 MED ORDER — AMPHETAMINE-DEXTROAMPHETAMINE 30 MG PO TABS: 30.0000 mg | ORAL_TABLET | Freq: Two times a day (BID) | ORAL | 0 refills | Status: DC

## 2019-01-16 MED ORDER — SERTRALINE HCL 100 MG PO TABS: ORAL_TABLET | ORAL | 3 refills | Status: DC

## 2019-01-16 NOTE — Assessment & Plan Note (Signed)
Assessment and Plan: [facility] Controlled Substance Database reviewed- no aberrancies noted Continue current Adderall regime Increased Sertraline from 50mg  to 100mg  QD Continue with therapist Continue regular exercise Remain well hydrated and follow heart healthy diet  COVID-19 Education: Signs and symptoms of COVID-19 infection were discussed with pt and how to seek care for testing.  The importance of following the Stay at Home order, and when out- Social Distancing and wearing a facial mask were discussed today.  Follow Up Instructions: 3 month f/u with fasting labs   I discussed the assessment and treatment plan with the patient. The patient was provided an opportunity to ask questions and all were answered. The patient agreed with the plan and demonstrated an understanding of the instructions.   The patient was advised to call back or seek an in-person evaluation if the symptoms worsen or if the condition fails to improve as anticipated.

## 2019-01-16 NOTE — Assessment & Plan Note (Signed)
Assessment and Plan: [facility] Controlled Substance Database reviewed- no aberrancies noted Continue current Adderall regime Increased Sertraline from 50mg  to 100mg  QD Continue with therapist Continue regular exercise Remain well hydrated and follow heart healthy diet  COVID-19 Education: Signs and symptoms of COVID-19 infection were discussed with pt and how to seek care for testing.  The importance of following the Stay at Home order, and when out- Social Distancing and wearing a facial mask were discussed today.  Follow Up Instructions: 3 month f/u with fasting labs   I discussed the assessment and treatment plan with the patient. The patient was provided an opportunity to ask questions and all were answered. The patient agreed with the plan and demonstrated an understanding of the instructions.   The patient was advised to call back or seek an in-person evaluation if the symptoms worsen or if the condition fails to improve as anticipated.

## 2019-02-09 ENCOUNTER — Other Ambulatory Visit: Payer: Self-pay | Admitting: Adult Health

## 2019-02-09 MED ORDER — ZOLPIDEM TARTRATE ER 12.5 MG PO TBCR: 12.5000 mg | EXTENDED_RELEASE_TABLET | Freq: Every day | ORAL | 0 refills | Status: DC

## 2019-02-09 MED ORDER — AMPHETAMINE-DEXTROAMPHETAMINE 30 MG PO TABS: 30.0000 mg | ORAL_TABLET | Freq: Two times a day (BID) | ORAL | 0 refills | Status: DC

## 2019-02-09 NOTE — Telephone Encounter (Signed)
LOV 01/16/2019 and both meds last filled at that time for 30 no refills. MPulliam, CMA/RT(R)

## 2019-03-08 ENCOUNTER — Other Ambulatory Visit: Payer: Self-pay | Admitting: Adult Health

## 2019-03-15 ENCOUNTER — Other Ambulatory Visit: Payer: Self-pay | Admitting: Adult Health

## 2019-03-15 MED ORDER — ZOLPIDEM TARTRATE ER 12.5 MG PO TBCR: 12.5000 mg | EXTENDED_RELEASE_TABLET | Freq: Every day | ORAL | 0 refills | Status: DC

## 2019-03-15 MED ORDER — AMPHETAMINE-DEXTROAMPHETAMINE 30 MG PO TABS: 30.0000 mg | ORAL_TABLET | Freq: Two times a day (BID) | ORAL | 0 refills | Status: DC

## 2019-04-18 ENCOUNTER — Other Ambulatory Visit: Payer: Self-pay | Admitting: Adult Health

## 2019-04-18 MED ORDER — ZOLPIDEM TARTRATE ER 12.5 MG PO TBCR: 12.5000 mg | EXTENDED_RELEASE_TABLET | Freq: Every day | ORAL | 0 refills | Status: DC

## 2019-06-12 ENCOUNTER — Other Ambulatory Visit: Payer: Self-pay | Admitting: Adult Health

## 2019-06-13 ENCOUNTER — Other Ambulatory Visit: Payer: Self-pay | Admitting: Adult Health

## 2019-06-13 NOTE — Telephone Encounter (Signed)
Good Afternoon Baker Janus,  Can please call Ms. Lovena Le and have her schedule and OV with fasting labs- need this prior to RX refill. Thanks! Valetta Fuller

## 2019-06-16 ENCOUNTER — Telehealth: Payer: Self-pay | Admitting: Adult Health

## 2019-06-16 NOTE — Telephone Encounter (Signed)
Called pt to set up appt regarding :  Refused Prescriptions   zolpidem (AMBIEN CR) 12.5 MG CR tablet       Sig: Take 1 tablet (12.5 mg total) by mouth at bedtime.   Disp:  30 tablet  Refills:  0   Start: 06/13/2019   Class: Normal   Refused by: Mina Marble D, NP   Refusal reason: Refill not appropriate (needs OV with labs prior to RF)   Non-formulary     ----Unable to leave message pt's voicemail box full.  --glh

## 2019-12-18 ENCOUNTER — Other Ambulatory Visit: Payer: Self-pay | Admitting: Adult Health

## 2019-12-18 NOTE — Telephone Encounter (Signed)
Patient called to scheduled required Appt for Rx refills on :   ---LOV 01/2019-- New appt 01/05/20(provider's 1st available)   1)--- zolpidem (AMBIEN CR) 12.5 MG CR tablet [id number]   Order Details Dose: 12.5 mg Route: Oral Frequency: Daily at bedtime  Dispense Quantity: 30 tablet Refills: 0   Indications of Use: Insomnia      Sig: Take 1 tablet (12.5 mg total) by mouth at bedtime.   &   amphetamine-dextroamphetamine (ADDERALL) 30 MG tablet [id number]   Order Details Dose: 30 mg Route: Oral Frequency: 2 times daily  Dispense Quantity: 60 tablet Refills: 0   Indications of Use: Attention Deficit Hyperactivity Disorder      Sig: Take 1 tablet by mouth 2 (two) times daily.    --Forwarding refill request to med asst that if approved send order to :  [id number] [address]  [place], [place] [address]  Phone:  [phone] Fax:  [phone]   --glh

## 2019-12-20 NOTE — Telephone Encounter (Signed)
Patient hasn't followed-up as previous PCP instructed, so she should have been out of medications for a while. Plan to discuss/RF at [place] on 01/05/20. Thank you.

## 2019-12-20 NOTE — Telephone Encounter (Signed)
Abigail Smith,  Can you please mark this refills as "refused"?  The system does not allow anyone but providers to approve or refuse controlled substances.  Thank you!

## 2019-12-28 ENCOUNTER — Telehealth: Payer: Self-pay | Admitting: Physician Assistant

## 2019-12-28 NOTE — Telephone Encounter (Signed)
Pt returned call.  Pt understands that she cannot have a refill of adderall until her visit. Pt was inquiring if she needed FBW with her OV.  Pt advised that she does need FBW at OV and to come fasting.  Charyl Bigger, CMA

## 2019-12-28 NOTE — Telephone Encounter (Signed)
Patient last seen 01/2019 and was advised then to follow up in 3 months. Patient last refill of Adderall given 03/2019.   Left message for patient to call back to advise no refills of adderall- per our office policy-until patient is seen. AS,CMA

## 2019-12-28 NOTE — Telephone Encounter (Signed)
Pt called states she is out of :  (she knows OV required & has scheduled Appt but also thinks Labs are needed? Please review chart for any necessary lab & advise if separate Appt needed for them)  amphetamine-dextroamphetamine (ADDERALL) 30 MG tablet [id number]   Order Details Dose: 30 mg Route: Oral Frequency: 2 times daily  Dispense Quantity: 60 tablet Refills: 0   Indications of Use: Attention Deficit Hyperactivity Disorder      Sig: Take 1 tablet by mouth 2 (two) times daily.   --Forwarding request to med asst that if Refill approved send order to :   [id number] [address], [place] [address]  Phone:  [phone] Fax:  [phone]   --glh      Pt scheduled : [facility]---IN OFFICE Appt --LOV 01/2019 may need labs but requesting Rx/Adderall refill--glh

## 2020-01-01 ENCOUNTER — Other Ambulatory Visit: Payer: Self-pay

## 2020-01-01 ENCOUNTER — Other Ambulatory Visit (INDEPENDENT_AMBULATORY_CARE_PROVIDER_SITE_OTHER): Payer: 59

## 2020-01-01 DIAGNOSIS — Z0000 Encounter for general adult medical examination without abnormal findings: Secondary | ICD-10-CM

## 2020-01-02 LAB — LIPID PANEL
Chol/HDL Ratio: 3.3 ratio (ref 0.0–4.4)
Cholesterol, Total: 216 mg/dL — ABNORMAL HIGH (ref 100–199)
HDL: 65 mg/dL (ref >39)
LDL Chol Calc (NIH): 126 mg/dL — ABNORMAL HIGH (ref 0–99)
Triglycerides: 144 mg/dL (ref 0–149)
VLDL Cholesterol Cal: 25 mg/dL (ref 5–40)

## 2020-01-02 LAB — COMPREHENSIVE METABOLIC PANEL
ALT: 29 IU/L (ref 0–32)
AST: 22 IU/L (ref 0–40)
Albumin/Globulin Ratio: 1.6 (ref 1.2–2.2)
Albumin: 4.2 g/dL (ref 3.8–4.8)
Alkaline Phosphatase: 34 IU/L — ABNORMAL LOW (ref 39–117)
BUN/Creatinine Ratio: 14 (ref 9–23)
BUN: 9 mg/dL (ref 6–20)
Bilirubin Total: 0.3 mg/dL (ref 0.0–1.2)
CO2: 20 mmol/L (ref 20–29)
Calcium: 9.1 mg/dL (ref 8.7–10.2)
Chloride: 100 mmol/L (ref 96–106)
Creatinine, Ser: 0.65 mg/dL (ref 0.57–1.00)
GFR calc Af Amer: 129 mL/min/{1.73_m2} (ref >59)
GFR calc non Af Amer: 112 mL/min/{1.73_m2} (ref >59)
Globulin, Total: 2.6 g/dL (ref 1.5–4.5)
Glucose: 117 mg/dL — ABNORMAL HIGH (ref 65–99)
Potassium: 4.6 mmol/L (ref 3.5–5.2)
Sodium: 135 mmol/L (ref 134–144)
Total Protein: 6.8 g/dL (ref 6.0–8.5)

## 2020-01-02 LAB — T3: T3, Total: 193 ng/dL — ABNORMAL HIGH (ref 71–180)

## 2020-01-02 LAB — CBC
Hematocrit: 37.4 % (ref 34.0–46.6)
Hemoglobin: 12.8 g/dL (ref 11.1–15.9)
MCH: 33.6 pg — ABNORMAL HIGH (ref 26.6–33.0)
MCHC: 34.2 g/dL (ref 31.5–35.7)
MCV: 98 fL — ABNORMAL HIGH (ref 79–97)
Platelets: 478 10*3/uL — ABNORMAL HIGH (ref 150–450)
RBC: 3.81 x10E6/uL (ref 3.77–5.28)
RDW: 11.6 % — ABNORMAL LOW (ref 11.7–15.4)
WBC: 7.5 10*3/uL (ref 3.4–10.8)

## 2020-01-02 LAB — HEMOGLOBIN A1C
Est. average glucose Bld gHb Est-mCnc: 111 mg/dL
Hgb A1c MFr Bld: 5.5 % (ref 4.8–5.6)

## 2020-01-02 LAB — TSH: TSH: 1.18 u[IU]/mL (ref 0.450–4.500)

## 2020-01-02 LAB — T4, FREE: Free T4: 1.15 ng/dL (ref 0.82–1.77)

## 2020-01-02 LAB — VITAMIN D 25 HYDROXY (VIT D DEFICIENCY, FRACTURES): Vit D, 25-Hydroxy: 52.9 ng/mL (ref 30.0–100.0)

## 2020-01-04 NOTE — Progress Notes (Deleted)
Established Patient Office Visit  Subjective:  Patient ID: Abigail Smith, female    DOB: Jan 04, 1981  Age: 39 y.o. MRN: [id number]  CC: No chief complaint on file.   HPI [name] presents for chronic follow-up.   Past Medical History:  Diagnosis Date  . ADHD   . Anemia     Past Surgical History:  Procedure Laterality Date  . BREAST REDUCTION SURGERY  2018  . DILATION AND CURETTAGE OF UTERUS     EAB    Family History  Problem Relation Age of Onset  . Hypertension Mother   . Heart disease Brother   . Cancer Maternal Grandmother        lung  . Macular degeneration Maternal Grandmother   . Cancer Paternal Grandmother        stomach  . Alcohol abuse Paternal Grandfather   . Cancer Paternal Grandfather        esophageal    Social History   Socioeconomic History  . Marital status: Married    Spouse name: Not on file  . Number of children: Not on file  . Years of education: Not on file  . Highest education level: Not on file  Occupational History  . Not on file  Tobacco Use  . Smoking status: Never Smoker  . Smokeless tobacco: Never Used  Substance and Sexual Activity  . Alcohol use: Not Currently    Alcohol/week: 3.0 - 4.0 standard drinks    Types: 3 - 4 Glasses of wine per week  . Drug use: No  . Sexual activity: Yes    Birth control/protection: None  Other Topics Concern  . Not on file  Social History Narrative  . Not on file   Social Determinants of Health   Financial Resource Strain:   . Difficulty of Paying Living Expenses:   Food Insecurity:   . Worried About Charity fundraiser in the Last Year:   . Arboriculturist in the Last Year:   Transportation Needs:   . Film/video editor (Medical):   Marland Kitchen Lack of Transportation (Non-Medical):   Physical Activity:   . Days of Exercise per Week:   . Minutes of Exercise per Session:   Stress:   . Feeling of Stress :   Social Connections:   . Frequency of Communication with Friends and Family:    . Frequency of Social Gatherings with Friends and Family:   . Attends Religious Services:   . Active Member of Clubs or Organizations:   . Attends Archivist Meetings:   Marland Kitchen Marital Status:   Intimate Partner Violence:   . Fear of Current or Ex-Partner:   . Emotionally Abused:   Marland Kitchen Physically Abused:   . Sexually Abused:     Outpatient Medications Prior to Visit  Medication Sig Dispense Refill  . amphetamine-dextroamphetamine (ADDERALL) 30 MG tablet Take 1 tablet by mouth 2 (two) times daily. 60 tablet 0  . magnesium oxide (MAG-OX) 400 MG tablet Take 400 mg by mouth daily.    . Prenatal Vit-Fe Fumarate-FA (PRENATAL VITAMIN PO) Take 1 tablet by mouth daily.    . sertraline (ZOLOFT) 100 MG tablet TAKE 1 TABLET BY MOUTH EVERY DAY 90 tablet 0  . zolpidem (AMBIEN CR) 12.5 MG CR tablet Take 1 tablet (12.5 mg total) by mouth at bedtime. 30 tablet 0   No facility-administered medications prior to visit.    Allergies  Allergen Reactions  . Vicodin [Hydrocodone-Acetaminophen] Hives  .  Vancomycin Other (See Comments)    Red man syndrome    ROS Review of Systems    A fourteen system review of systems was performed and found to be positive as per HPI.   Objective:    Physical Exam   General: Well nourished, in no apparent distress. Eyes: PERRLA, EOMs, conjunctiva clr Resp: Respiratory effort- normal, ECTA B/L w/o W/R/R  Cardio: RRR w/o MRGs. Abdomen: no gross distention. Lymphatics:  less 2 sec cap RF M-sk: Full ROM, 5/5 strength, normal gait.  Skin: Warm, dry  Neuro: Alert, Oriented Psych: Normal affect, Insight and Judgment appropriate.   There were no vitals taken for this visit. Wt Readings from Last 3 Encounters:  01/16/19 152 lb (68.9 kg)  10/06/18 151 lb 11.2 oz (68.8 kg)  05/12/18 152 lb 11.2 oz (69.3 kg)     Health Maintenance Due  Topic Date Due  . PAP SMEAR-Modifier  Never done  . TETANUS/TDAP  12/31/2019    There are no preventive care  reminders to display for this patient.  Lab Results  Component Value Date   TSH 1.180 01/01/2020   Lab Results  Component Value Date   WBC 7.5 01/01/2020   HGB 12.8 01/01/2020   HCT 37.4 01/01/2020   MCV 98 (H) 01/01/2020   PLT 478 (H) 01/01/2020   Lab Results  Component Value Date   NA 135 01/01/2020   K 4.6 01/01/2020   CO2 20 01/01/2020   GLUCOSE 117 (H) 01/01/2020   BUN 9 01/01/2020   CREATININE 0.65 01/01/2020   BILITOT 0.3 01/01/2020   ALKPHOS 34 (L) 01/01/2020   AST 22 01/01/2020   ALT 29 01/01/2020   PROT 6.8 01/01/2020   ALBUMIN 4.2 01/01/2020   CALCIUM 9.1 01/01/2020   Lab Results  Component Value Date   CHOL 216 (H) 01/01/2020   Lab Results  Component Value Date   HDL 65 01/01/2020   Lab Results  Component Value Date   LDLCALC 126 (H) 01/01/2020   Lab Results  Component Value Date   TRIG 144 01/01/2020   Lab Results  Component Value Date   CHOLHDL 3.3 01/01/2020   Lab Results  Component Value Date   HGBA1C 5.5 01/01/2020      Assessment & Plan:   Problem List Items Addressed This Visit    None      No orders of the defined types were placed in this encounter.   Follow-up: No follow-ups on file.    Lorrene Reid, PA-C

## 2020-01-05 ENCOUNTER — Ambulatory Visit: Payer: 59 | Admitting: Physician Assistant

## 2020-01-08 ENCOUNTER — Telehealth (INDEPENDENT_AMBULATORY_CARE_PROVIDER_SITE_OTHER): Payer: 59 | Admitting: Physician Assistant

## 2020-01-08 ENCOUNTER — Encounter: Payer: Self-pay | Admitting: Physician Assistant

## 2020-01-08 ENCOUNTER — Other Ambulatory Visit: Payer: Self-pay

## 2020-01-08 VITALS — BP 109/76 | HR 81 | Ht 67.0 in | Wt 160.0 lb

## 2020-01-08 DIAGNOSIS — F411 Generalized anxiety disorder: Secondary | ICD-10-CM

## 2020-01-08 DIAGNOSIS — F909 Attention-deficit hyperactivity disorder, unspecified type: Secondary | ICD-10-CM | POA: Diagnosis not present

## 2020-01-08 DIAGNOSIS — Z0000 Encounter for general adult medical examination without abnormal findings: Secondary | ICD-10-CM | POA: Diagnosis not present

## 2020-01-08 DIAGNOSIS — G4700 Insomnia, unspecified: Secondary | ICD-10-CM

## 2020-01-08 DIAGNOSIS — R7989 Other specified abnormal findings of blood chemistry: Secondary | ICD-10-CM

## 2020-01-08 MED ORDER — VENLAFAXINE HCL ER 37.5 MG PO CP24: 37.5000 mg | ORAL_CAPSULE | Freq: Every day | ORAL | 3 refills | Status: DC

## 2020-01-08 MED ORDER — AMPHETAMINE-DEXTROAMPHETAMINE 30 MG PO TABS: 30.0000 mg | ORAL_TABLET | Freq: Two times a day (BID) | ORAL | 0 refills | Status: DC

## 2020-01-08 MED ORDER — ZOLPIDEM TARTRATE ER 12.5 MG PO TBCR: 12.5000 mg | EXTENDED_RELEASE_TABLET | Freq: Every day | ORAL | 0 refills | Status: DC

## 2020-01-08 NOTE — Progress Notes (Signed)
Telehealth office visit note for Abigail Reid, PA-C- at Primary Care at [facility]   I connected with current patient today and verified that I am speaking with the correct person   . Location of the patient: Home . Location of the provider: Office - This visit type was conducted due to national recommendations for restrictions regarding the COVID-19 Pandemic (e.g. social distancing) in an effort to limit this patient's exposure and mitigate transmission in our community.    - No physical exam could be performed with this format, beyond that communicated to [place] by the patient/ family members as noted.   - Additionally my office staff/ schedulers were to discuss with the patient that there may be a monetary charge related to this service, depending on their medical insurance.  My understanding is that patient understood and consented to proceed.     _________________________________________________________________________________   History of Present Illness: Pt calls in to request medication refills and discuss lab results. Pt needs refill on Adderall 30 mg BID which helps her focus. Taking medication as directed without any side effects. Denies chest pain, palpitations, or tachycardia. Pt requests refill on Ambien to help with her insomnia. States she has noticed needing it more, about 4-5 times/wk. Reports her insomnia has worsened and has tried melatonin, tea, and a good sleep hygiene with minimal success. She continues to see her therapist. States she self-discontinued Sertraline last fall because it was not effective and didn't like how she felt while taking it. She has tried Celexa and Zoloft in the past. She has also tried Buspar and it made her feel very tired.    Discussed lab results. Most labs recently obtained are essentially within normal limits or are stable and unchanged from prior.  - CMP stable- glucose elevated but A1c 5.5 - Lipid panel- total cholesterol elevated at 216, LDL  elevated at 126 so recommend heart healthy diet low in cholesterol/sat and trans fat. Will continue to monitor. - CBC- stable from prior with elevated platelets. Plan to recheck in 6 weeks and if still elevated, will consider additional evaluation.  - Thyroid panel- elevated T3. No previous hx of thyroid disorder.       GAD 7 : Generalized Anxiety Score 01/08/2020  Nervous, Anxious, on Edge 3  Control/stop worrying 1  Worry too much - different things 1  Trouble relaxing 3  Restless 2  Easily annoyed or irritable 0  Afraid - awful might happen 0  Total GAD 7 Score 10  Anxiety Difficulty Somewhat difficult    Depression screen [facility] 2/9 01/08/2020 10/06/2018 05/12/2018 01/19/2018  Decreased Interest 0 0 0 0  Down, Depressed, Hopeless 0 1 0 0  PHQ - 2 Score 0 1 0 0  Altered sleeping 3 3 2 3   Tired, decreased energy 3 1 1 3   Change in appetite 0 2 1 0  Feeling bad or failure about yourself  0 0 0 0  Trouble concentrating 3 2 0 3  Moving slowly or fidgety/restless 0 0 0 2  Suicidal thoughts 0 - 0 0  PHQ-9 Score 9 9 4 11   Difficult doing work/chores Somewhat difficult Somewhat difficult Not difficult at all Somewhat difficult      Impression and Recommendations:     1. Attention deficit hyperactivity disorder (ADHD), unspecified ADHD type   2. GAD (generalized anxiety disorder)   3. Healthcare maintenance   4. Insomnia, unspecified type   5. Abnormal thyroid blood test   6. Abnormal CBC  GAD: - GAD-7 score of 10 indicating moderate anxiety and pt has tried several medications that have been ineffective. Pt agreeable to starting Effexor 37.5 mg once daily and recommended to follow-up by [facility] or phone call in 4-6 weeks if dose not effective and will consider increasing to 75 mg. Symptoms are more consistent with anxiety and less depression. Denies SI/HI.  - Continue sessions with therapist.   ADHD: - [place] Controlled Substance Database reviewed and no aberrancies noted. - Continue  Adderall 30 mg twice daily. Refill sent.  Insomnia: - Ambien helps with insomnia. - No aberrancies noted on [place] Controlled Substance Database. - Continue Ambien 12.5 mg. Refill sent. - Encourage to continue trying good sleep hygiene.  Abnormal thyroid test- elevated T3: - Normal TSH and free T4, T3 mildly elevated at 193 -  Pt reports symptoms of heat intolerance and increased bowel movements.  - Will place order to recheck in 6 weeks.   Abnormal CBC- elevated platelets: - Platelets 478,000, pt stable.  - Will place order to recheck in 6 weeks and if still elevated, will consider additional evaluation.    - As part of my medical decision making, I reviewed the following data within the [place] History obtained from pt /family, CMA notes reviewed and incorporated if applicable, Labs reviewed, Radiograph/ tests reviewed if applicable and OV notes from prior OV's with me, as well as any other specialists she/he has seen since seeing me last, were all reviewed and used in my medical decision making process today.    - Additionally, when appropriate, discussion had with patient regarding our treatment plan, and their biases/concerns about that plan were used in my medical decision making today.    - The patient agreed with the plan and demonstrated an understanding of the instructions. No barriers to understanding were identified.     - The patient was advised to call back or seek an in-person evaluation if the symptoms worsen or if the condition fails to improve as anticipated.   Return in about 3 months (around 04/09/2020) for mood management- new med started.    Orders Placed This Encounter  Procedures  . Thyroid Panel With TSH  . CBC w/Diff    Meds ordered this encounter  Medications  . amphetamine-dextroamphetamine (ADDERALL) 30 MG tablet    Sig: Take 1 tablet by mouth 2 (two) times daily.    Dispense:  60 tablet    Refill:  0  . zolpidem (AMBIEN CR) 12.5 MG  CR tablet    Sig: Take 1 tablet (12.5 mg total) by mouth at bedtime.    Dispense:  30 tablet    Refill:  0  . venlafaxine XR (EFFEXOR XR) 37.5 MG 24 hr capsule    Sig: Take 1 capsule (37.5 mg total) by mouth daily with breakfast.    Dispense:  30 capsule    Refill:  3    Order Specific Question:   Supervising Provider    Answer:   Beatrice Lecher D [[id number]]    Medications Discontinued During This Encounter  Medication Reason  . Prenatal Vit-Fe Fumarate-FA (PRENATAL VITAMIN PO) Error  . amphetamine-dextroamphetamine (ADDERALL) 30 MG tablet Reorder  . zolpidem (AMBIEN CR) 12.5 MG CR tablet Reorder       Time spent on visit including pre-visit chart review and post-visit care was 21 minutes.      The [place] was signed into law in 2016 which includes the topic of electronic health records.  This provides immediate access to information in MyChart.  This includes consultation notes, operative notes, office notes, lab results and pathology reports.  If you have any questions about what you read please let us know at your next visit or call us at the office.  We are right here with you.   __________________________________________________________________________________     Patient Care Team    Relationship Specialty Notifications Start End  Osborne Oman, MD Attending Physician Obstetrics and Gynecology  01/19/18      -Vitals obtained; medications/ allergies reconciled;  personal medical, social, Sx etc.histories were updated by CMA, reviewed by me and are reflected in chart   Patient Active Problem List   Diagnosis Date Noted  . GAD (generalized anxiety disorder) 10/06/2018  . Supervision of high risk pregnancy, antepartum 02/17/2018  . AMA (advanced maternal age) primigravida 43+, first trimester 02/17/2018  . Missed abortion with fetal demise before 67 completed weeks of gestation 02/17/2018  . Healthcare maintenance 01/19/2018  . ADHD (attention  deficit hyperactivity disorder) 01/19/2018  . Insomnia 01/19/2018     Current Meds  Medication Sig  . amphetamine-dextroamphetamine (ADDERALL) 30 MG tablet Take 1 tablet by mouth 2 (two) times daily.  . magnesium oxide (MAG-OX) 400 MG tablet Take 400 mg by mouth daily.  Marland Kitchen zolpidem (AMBIEN CR) 12.5 MG CR tablet Take 1 tablet (12.5 mg total) by mouth at bedtime.  . [DISCONTINUED] amphetamine-dextroamphetamine (ADDERALL) 30 MG tablet Take 1 tablet by mouth 2 (two) times daily.  . [DISCONTINUED] zolpidem (AMBIEN CR) 12.5 MG CR tablet Take 1 tablet (12.5 mg total) by mouth at bedtime.     Allergies:  Allergies  Allergen Reactions  . Vicodin [Hydrocodone-Acetaminophen] Hives  . Vancomycin Other (See Comments)    Red man syndrome     ROS:  See above HPI for pertinent positives and negatives   Objective:   Blood pressure 109/76, pulse 81, height 5\' 7"  (1.702 m), weight 160 lb (72.6 kg), last menstrual period 11/06/2019.  (if some vitals are omitted, this means that patient was UNABLE to obtain them even though they were asked to get them prior to OV today.  They were asked to call us at their earliest convenience with these once obtained. ) General: A & O * 3; sounds in no acute distress; in usual state of health.  Skin: Pt confirms warm and dry extremities and pink fingertips HEENT: Pt confirms lips non-cyanotic Chest: Patient confirms normal chest excursion and movement Respiratory: speaking in full sentences, no conversational dyspnea; patient confirms no use of accessory muscles Psych: insight appears good, mood- appears stable

## 2020-01-30 ENCOUNTER — Other Ambulatory Visit: Payer: Self-pay | Admitting: Physician Assistant

## 2020-01-30 DIAGNOSIS — F411 Generalized anxiety disorder: Secondary | ICD-10-CM

## 2020-02-01 ENCOUNTER — Other Ambulatory Visit: Payer: Self-pay | Admitting: Physician Assistant

## 2020-02-01 DIAGNOSIS — F909 Attention-deficit hyperactivity disorder, unspecified type: Secondary | ICD-10-CM

## 2020-02-01 DIAGNOSIS — G4700 Insomnia, unspecified: Secondary | ICD-10-CM

## 2020-02-02 MED ORDER — ZOLPIDEM TARTRATE ER 12.5 MG PO TBCR: 12.5000 mg | EXTENDED_RELEASE_TABLET | Freq: Every day | ORAL | 0 refills | Status: DC

## 2020-02-02 MED ORDER — AMPHETAMINE-DEXTROAMPHETAMINE 30 MG PO TABS: 30.0000 mg | ORAL_TABLET | Freq: Two times a day (BID) | ORAL | 0 refills | Status: DC

## 2020-02-02 NOTE — Telephone Encounter (Signed)
Patient was last seen 01/08/20 and advised to follow up in 3 months.   Last refill of Ambien given 01/08/20 #30 no refills  Last refill of Adderall given 01/08/20 #60 no refills.    Please approve if refills deemed appropriate. AS, CMA

## 2020-03-07 ENCOUNTER — Other Ambulatory Visit: Payer: Self-pay | Admitting: Physician Assistant

## 2020-03-07 DIAGNOSIS — G4700 Insomnia, unspecified: Secondary | ICD-10-CM

## 2020-03-11 ENCOUNTER — Other Ambulatory Visit: Payer: Self-pay | Admitting: Physician Assistant

## 2020-03-11 DIAGNOSIS — F909 Attention-deficit hyperactivity disorder, unspecified type: Secondary | ICD-10-CM

## 2020-03-12 MED ORDER — AMPHETAMINE-DEXTROAMPHETAMINE 30 MG PO TABS: 30.0000 mg | ORAL_TABLET | Freq: Two times a day (BID) | ORAL | 0 refills | Status: DC

## 2020-03-12 NOTE — Telephone Encounter (Signed)
Patient last seen 01/08/20 and advised to follow up in 3 months.    Last refill given 02/02/20 #30 no refills.    Please approve refill if deemed appropriate. AS< CMA

## 2020-03-12 NOTE — Telephone Encounter (Signed)
Patient last seen 01/08/20 and advised to follow up in 3 months.    Last refill given 02/02/20#30 days with no refills.     Please approve if refill deemed appropriate. AS, CMA

## 2020-03-31 ENCOUNTER — Other Ambulatory Visit: Payer: Self-pay | Admitting: Physician Assistant

## 2020-03-31 DIAGNOSIS — F411 Generalized anxiety disorder: Secondary | ICD-10-CM

## 2020-04-01 ENCOUNTER — Other Ambulatory Visit: Payer: Self-pay | Admitting: Physician Assistant

## 2020-04-01 DIAGNOSIS — F411 Generalized anxiety disorder: Secondary | ICD-10-CM

## 2020-04-01 MED ORDER — VENLAFAXINE HCL ER 37.5 MG PO CP24: 37.5000 mg | ORAL_CAPSULE | Freq: Every day | ORAL | 0 refills | Status: DC

## 2020-04-09 ENCOUNTER — Other Ambulatory Visit: Payer: Self-pay | Admitting: Physician Assistant

## 2020-04-09 DIAGNOSIS — G4700 Insomnia, unspecified: Secondary | ICD-10-CM

## 2020-04-10 NOTE — Telephone Encounter (Signed)
Patient last seen 01/08/20 and advised to follow up in 3 months.   Patient does not have future apt scheduled.    Please approve if refill deemed appropriate. AS, CMA

## 2020-04-25 ENCOUNTER — Other Ambulatory Visit: Payer: Self-pay | Admitting: Physician Assistant

## 2020-04-25 DIAGNOSIS — F411 Generalized anxiety disorder: Secondary | ICD-10-CM

## 2020-04-29 ENCOUNTER — Other Ambulatory Visit: Payer: Self-pay | Admitting: Physician Assistant

## 2020-04-29 DIAGNOSIS — F909 Attention-deficit hyperactivity disorder, unspecified type: Secondary | ICD-10-CM

## 2020-04-29 NOTE — Addendum Note (Signed)
Addended by: Mickel Crow on: 04/29/2020 04:21 PM   Modules accepted: Orders

## 2020-04-29 NOTE — Telephone Encounter (Signed)
Patient scheduled a telehealth med f/u appt for the end of Sept but will be out of her Adderall before that appt date. She is requesting a refill to be sent to [facility] on [address] please.

## 2020-04-29 NOTE — Telephone Encounter (Signed)
Patient last seen 01/08/20 and advised to follow up every 3 months.   Last refill given 03/12/20 #60 with no refills.   Please review note from [place] and approve if deemed appropriate. AS, CMA

## 2020-04-30 ENCOUNTER — Other Ambulatory Visit: Payer: Self-pay | Admitting: Physician Assistant

## 2020-04-30 DIAGNOSIS — F909 Attention-deficit hyperactivity disorder, unspecified type: Secondary | ICD-10-CM

## 2020-04-30 DIAGNOSIS — F411 Generalized anxiety disorder: Secondary | ICD-10-CM

## 2020-05-01 ENCOUNTER — Other Ambulatory Visit: Payer: Self-pay | Admitting: Physician Assistant

## 2020-05-01 DIAGNOSIS — F909 Attention-deficit hyperactivity disorder, unspecified type: Secondary | ICD-10-CM

## 2020-05-01 MED ORDER — VENLAFAXINE HCL ER 37.5 MG PO CP24: 37.5000 mg | ORAL_CAPSULE | Freq: Every day | ORAL | 0 refills | Status: DC

## 2020-05-01 MED ORDER — AMPHETAMINE-DEXTROAMPHETAMINE 30 MG PO TABS: 30.0000 mg | ORAL_TABLET | Freq: Two times a day (BID) | ORAL | 0 refills | Status: DC

## 2020-05-01 NOTE — Telephone Encounter (Signed)
This is a duplicate request to the one that is already in your inbasket. I am unable to deny since this is a controlled substance. AS, CMA

## 2020-05-01 NOTE — Telephone Encounter (Signed)
Patient last seen 01/08/20 and advised to follow up in 3 months.   Last refill given 03/12/20 #60 no refills.   Patient has apt scheduled 06/04/20 with you.   Please approve if deemed appropriate. AS, CMA

## 2020-05-13 ENCOUNTER — Other Ambulatory Visit: Payer: Self-pay | Admitting: Physician Assistant

## 2020-05-13 DIAGNOSIS — G4700 Insomnia, unspecified: Secondary | ICD-10-CM

## 2020-05-14 NOTE — Telephone Encounter (Signed)
Patient last seen 01/08/20 and advised to follow up in 3 months.   Patient has apt scheduled 06/04/20.

## 2020-05-16 ENCOUNTER — Other Ambulatory Visit: Payer: Self-pay | Admitting: Physician Assistant

## 2020-05-16 DIAGNOSIS — G4700 Insomnia, unspecified: Secondary | ICD-10-CM

## 2020-05-23 ENCOUNTER — Encounter: Payer: Self-pay | Admitting: Physician Assistant

## 2020-05-24 ENCOUNTER — Other Ambulatory Visit: Payer: Self-pay | Admitting: Physician Assistant

## 2020-05-24 DIAGNOSIS — F909 Attention-deficit hyperactivity disorder, unspecified type: Secondary | ICD-10-CM

## 2020-05-24 MED FILL — AMPHETAMINE SALTS 30 MG TAB: 30 | 30 days supply | Qty: 60 | Fill #0

## 2020-05-24 NOTE — Telephone Encounter (Signed)
Please refuse med refill. Med was sent to [facility] outpatient pharmacy and patient is aware. AS, CMA

## 2020-06-04 ENCOUNTER — Encounter: Payer: Self-pay | Admitting: Physician Assistant

## 2020-06-04 ENCOUNTER — Ambulatory Visit (INDEPENDENT_AMBULATORY_CARE_PROVIDER_SITE_OTHER): Payer: 59 | Admitting: Physician Assistant

## 2020-06-04 ENCOUNTER — Other Ambulatory Visit: Payer: Self-pay

## 2020-06-04 VITALS — Ht 67.0 in | Wt 170.0 lb

## 2020-06-04 DIAGNOSIS — G4700 Insomnia, unspecified: Secondary | ICD-10-CM | POA: Diagnosis not present

## 2020-06-04 DIAGNOSIS — F909 Attention-deficit hyperactivity disorder, unspecified type: Secondary | ICD-10-CM | POA: Diagnosis not present

## 2020-06-04 DIAGNOSIS — F411 Generalized anxiety disorder: Secondary | ICD-10-CM

## 2020-06-04 MED ORDER — HYDROXYZINE HCL 50 MG PO TABS: ORAL_TABLET | ORAL | 1 refills | Status: DC

## 2020-06-04 MED ORDER — AMPHETAMINE-DEXTROAMPHETAMINE 30 MG PO TABS: 30.0000 mg | ORAL_TABLET | Freq: Two times a day (BID) | ORAL | 0 refills | Status: DC

## 2020-06-04 MED ORDER — VENLAFAXINE HCL ER 75 MG PO CP24: 75.0000 mg | ORAL_CAPSULE | Freq: Every day | ORAL | 2 refills | Status: DC

## 2020-06-04 NOTE — Progress Notes (Signed)
Telehealth office visit note for Abigail Reid, PA-C- at Primary Care at [facility]   I connected with current patient today by telephone and verified that I am speaking with the correct person   . Location of the patient: Home . Location of the provider: Office - This visit type was conducted due to national recommendations for restrictions regarding the COVID-19 Pandemic (e.g. social distancing) in an effort to limit this patient's exposure and mitigate transmission in our community.    - No physical exam could be performed with this format, beyond that communicated to [place] by the patient/ family members as noted.   - Additionally my office staff/ schedulers were to discuss with the patient that there may be a monetary charge related to this service, depending on their medical insurance.  My understanding is that patient understood and consented to proceed.     _________________________________________________________________________________   History of Present Illness: Patient calls in to follow-up on ADHD and mood.  At last telehealth visit patient was started on Effexor 37.5 mg.  Reports she felt better when she started it but about 1 month ago she started feeling like before, so she self increase the dose to 75 mg about 3 weeks ago.  Tolerating the increased dose without issues.  States she has issues with situational anxiety.  She is sleeping better and continues to take Ambien on most nights.  ADHD: Taking Adderall which helps with concentration and focus.  Has no concerns.     GAD 7 : Generalized Anxiety Score 06/04/2020 01/08/2020  Nervous, Anxious, on Edge 2 3  Control/stop worrying 1 1  Worry too much - different things 1 1  Trouble relaxing 1 3  Restless 1 2  Easily annoyed or irritable 0 0  Afraid - awful might happen 1 0  Total GAD 7 Score 7 10  Anxiety Difficulty Very difficult Somewhat difficult    Depression screen [facility] 2/9 06/04/2020 01/08/2020 10/06/2018  05/12/2018 01/19/2018  Decreased Interest 0 0 0 0 0  Down, Depressed, Hopeless 0 0 1 0 0  PHQ - 2 Score 0 0 1 0 0  Altered sleeping 3 3 3 2 3   Tired, decreased energy 3 3 1 1 3   Change in appetite 0 0 2 1 0  Feeling bad or failure about yourself  0 0 0 0 0  Trouble concentrating 1 3 2  0 3  Moving slowly or fidgety/restless 1 0 0 0 2  Suicidal thoughts 0 0 - 0 0  PHQ-9 Score 8 9 9 4 11   Difficult doing work/chores Somewhat difficult Somewhat difficult Somewhat difficult Not difficult at all Somewhat difficult      Impression and Recommendations:     1. GAD (generalized anxiety disorder)   2. Attention deficit hyperactivity disorder (ADHD), unspecified ADHD type   3. Insomnia, unspecified type     GAD: -GAD-7 score of 7, PHQ-9 score of 9 -Patient tolerating increased dose of Effexor so we will send a prescription for 75 mg. -Will start hydroxyzine as adjunct therapy for as needed for severe anxiety.  Advised patient to take 0.5-1 tablet as needed.  Advised can take at bedtime to help with sleep and instructed to not take with Ambien. -Will continue to monitor.  ADHD: -Stable -PDMP reviewed, no aberrancies noted.  Recently filled prescription.  Will send refill with future date. -Follow-up in 3 months for medication management.  Insomnia: -Stable -PDMP reviewed, refilling Ambien about once per month. Recommend  to reduce Ambien use and can trial hydroxyzine for insomnia. Advised not to take Ambien and hydroxyzine together. -Encouraged to establish a good sleep hygiene. -Will continue to monitor.   - As part of my medical decision making, I reviewed the following data within the [place] History obtained from pt /family, CMA notes reviewed and incorporated if applicable, Labs reviewed, Radiograph/ tests reviewed if applicable and OV notes from prior OV's with me, as well as any other specialists she/he has seen since seeing me last, were all reviewed and used in  my medical decision making process today.    - Additionally, when appropriate, discussion had with patient regarding our treatment plan, and their biases/concerns about that plan were used in my medical decision making today.    - The patient agreed with the plan and demonstrated an understanding of the instructions.   No barriers to understanding were identified.     - The patient was advised to call back or seek an in-person evaluation if the symptoms worsen or if the condition fails to improve as anticipated.   Return in about 3 months (around 09/03/2020) for ADHD and mood-inc med, management.    No orders of the defined types were placed in this encounter.   Meds ordered this encounter  Medications  . amphetamine-dextroamphetamine (ADDERALL) 30 MG tablet    Sig: Take 1 tablet by mouth 2 (two) times daily.    Dispense:  60 tablet    Refill:  0  . venlafaxine XR (EFFEXOR XR) 75 MG 24 hr capsule    Sig: Take 1 capsule (75 mg total) by mouth daily with breakfast.    Dispense:  30 capsule    Refill:  2    Order Specific Question:   Supervising Provider    Answer:   Beatrice Lecher D [[id number]]  . hydrOXYzine (ATARAX/VISTARIL) 50 MG tablet    Sig: Take 0.5-1 tablet by mouth every 6-8 hours as needed for severe anxiety.    Dispense:  30 tablet    Refill:  1    Order Specific Question:   Supervising Provider    Answer:   Beatrice Lecher D [[id number]]    Medications Discontinued During This Encounter  Medication Reason  . venlafaxine XR (EFFEXOR-XR) 37.5 MG 24 hr capsule Dose change  . amphetamine-dextroamphetamine (ADDERALL) 30 MG tablet Reorder       Time spent on visit including pre-visit chart review and post-visit care was 13 minutes.      The [place] was signed into law in 2016 which includes the topic of electronic health records.  This provides immediate access to information in MyChart.  This includes consultation notes, operative notes, office  notes, lab results and pathology reports.  If you have any questions about what you read please let us know at your next visit or call us at the office.  We are right here with you.  Note:  This note was prepared with assistance of Dragon voice recognition software. Occasional wrong-word or sound-a-like substitutions may have occurred due to the inherent limitations of voice recognition software.  __________________________________________________________________________________     Patient Care Team    Relationship Specialty Notifications Start End  Abigail Smith, [place] PCP - General Physician Assistant  02/02/20   Osborne Oman, MD Attending Physician Obstetrics and Gynecology  01/19/18      -Vitals obtained; medications/ allergies reconciled;  personal medical, social, Sx etc.histories were updated by CMA, reviewed by me and are  reflected in chart   Patient Active Problem List   Diagnosis Date Noted  . GAD (generalized anxiety disorder) 10/06/2018  . Supervision of high risk pregnancy, antepartum 02/17/2018  . AMA (advanced maternal age) primigravida 4+, first trimester 02/17/2018  . Missed abortion with fetal demise before 63 completed weeks of gestation 02/17/2018  . Healthcare maintenance 01/19/2018  . ADHD (attention deficit hyperactivity disorder) 01/19/2018  . Insomnia 01/19/2018     Current Meds  Medication Sig  . [START ON 06/22/2020] amphetamine-dextroamphetamine (ADDERALL) 30 MG tablet Take 1 tablet by mouth 2 (two) times daily.  . magnesium oxide (MAG-OX) 400 MG tablet Take 400 mg by mouth daily.  Marland Kitchen zolpidem (AMBIEN CR) 12.5 MG CR tablet TAKE 1 TABLET BY MOUTH EVERYDAY AT BEDTIME  . [DISCONTINUED] amphetamine-dextroamphetamine (ADDERALL) 30 MG tablet Take 1 tablet by mouth 2 (two) times daily.  . [DISCONTINUED] venlafaxine XR (EFFEXOR-XR) 37.5 MG 24 hr capsule Take 1 capsule (37.5 mg total) by mouth daily with breakfast.     Allergies:  Allergies  Allergen  Reactions  . Vicodin [Hydrocodone-Acetaminophen] Hives  . Vancomycin Other (See Comments)    Red man syndrome     ROS:  See above HPI for pertinent positives and negatives   Objective:   Height 5\' 7"  (1.702 m), weight 170 lb (77.1 kg), last menstrual period 05/21/2020.  (if some vitals are omitted, this means that patient was UNABLE to obtain them even though they were asked to get them prior to [place] today.  They were asked to call us at their earliest convenience with these once obtained. ) General: A & O * 3; sounds in no acute distress Respiratory: speaking in full sentences, no conversational dyspnea Psych: insight appears good, mood- appears full

## 2020-06-17 ENCOUNTER — Other Ambulatory Visit: Payer: Self-pay | Admitting: Physician Assistant

## 2020-06-17 DIAGNOSIS — G4700 Insomnia, unspecified: Secondary | ICD-10-CM

## 2020-07-08 ENCOUNTER — Other Ambulatory Visit: Payer: Self-pay | Admitting: Physician Assistant

## 2020-07-08 DIAGNOSIS — F411 Generalized anxiety disorder: Secondary | ICD-10-CM

## 2020-07-13 ENCOUNTER — Other Ambulatory Visit: Payer: Self-pay | Admitting: Physician Assistant

## 2020-07-13 DIAGNOSIS — G4700 Insomnia, unspecified: Secondary | ICD-10-CM

## 2020-07-15 MED ORDER — ZOLPIDEM TARTRATE ER 12.5 MG PO TBCR: 12.5000 mg | EXTENDED_RELEASE_TABLET | Freq: Every evening | ORAL | 0 refills | Status: DC | PRN

## 2020-07-15 NOTE — Telephone Encounter (Signed)
Last RX #30.  RTC 09/03/2020.  Charyl Bigger, CMA

## 2020-07-24 ENCOUNTER — Other Ambulatory Visit: Payer: Self-pay | Admitting: Physician Assistant

## 2020-07-24 DIAGNOSIS — F411 Generalized anxiety disorder: Secondary | ICD-10-CM

## 2020-07-25 ENCOUNTER — Other Ambulatory Visit: Payer: Self-pay | Admitting: Physician Assistant

## 2020-07-25 DIAGNOSIS — F909 Attention-deficit hyperactivity disorder, unspecified type: Secondary | ICD-10-CM

## 2020-07-25 MED ORDER — AMPHETAMINE-DEXTROAMPHETAMINE 30 MG PO TABS: 30.0000 mg | ORAL_TABLET | Freq: Two times a day (BID) | ORAL | 0 refills | Status: DC

## 2020-07-25 NOTE — Telephone Encounter (Signed)
Patient last seen 06/04/20 and advised to follow up in 3 months.    Patient does not have an appointment scheduled.   Please approve if refill deemed appropriate. AS, CMA

## 2020-08-12 ENCOUNTER — Other Ambulatory Visit: Payer: Self-pay | Admitting: Physician Assistant

## 2020-08-12 DIAGNOSIS — G4700 Insomnia, unspecified: Secondary | ICD-10-CM

## 2020-08-13 ENCOUNTER — Other Ambulatory Visit: Payer: Self-pay | Admitting: Physician Assistant

## 2020-08-13 ENCOUNTER — Encounter: Payer: Self-pay | Admitting: Physician Assistant

## 2020-08-13 DIAGNOSIS — F411 Generalized anxiety disorder: Secondary | ICD-10-CM

## 2020-08-13 DIAGNOSIS — G4700 Insomnia, unspecified: Secondary | ICD-10-CM

## 2020-08-13 MED ORDER — ZOLPIDEM TARTRATE ER 12.5 MG PO TBCR: 12.5000 mg | EXTENDED_RELEASE_TABLET | Freq: Every evening | ORAL | 0 refills | Status: DC | PRN

## 2020-08-13 NOTE — Telephone Encounter (Signed)
Patient last seen 06/04/20 and advised to follow up in 3 months.   Last refill of Ambien given 07/18/2020 #30. AS, CMA

## 2020-08-14 MED ORDER — VENLAFAXINE HCL ER 150 MG PO CP24: 150.0000 mg | ORAL_CAPSULE | Freq: Every day | ORAL | 0 refills | Status: DC

## 2020-08-14 NOTE — Telephone Encounter (Signed)
Patient is requesting to increase effexor dose. Patient has been made aware via mychart that she needs a follow up appointment. AS, CMA

## 2020-08-27 ENCOUNTER — Ambulatory Visit (INDEPENDENT_AMBULATORY_CARE_PROVIDER_SITE_OTHER): Payer: 59 | Admitting: Physician Assistant

## 2020-08-27 ENCOUNTER — Other Ambulatory Visit: Payer: Self-pay

## 2020-08-27 ENCOUNTER — Encounter: Payer: Self-pay | Admitting: Physician Assistant

## 2020-08-27 DIAGNOSIS — Z5329 Procedure and treatment not carried out because of patient's decision for other reasons: Secondary | ICD-10-CM

## 2020-09-06 ENCOUNTER — Other Ambulatory Visit: Payer: Self-pay | Admitting: Physician Assistant

## 2020-09-23 ENCOUNTER — Other Ambulatory Visit: Payer: Self-pay | Admitting: Physician Assistant

## 2020-10-07 ENCOUNTER — Other Ambulatory Visit: Payer: Self-pay | Admitting: Physician Assistant

## 2020-10-07 DIAGNOSIS — G4700 Insomnia, unspecified: Secondary | ICD-10-CM

## 2020-10-07 NOTE — Telephone Encounter (Signed)
Patient last seen 9/21 and advised to follow up in 3 months. Pt no showed apt with Evlyn Clines in December. Pt does not have any apts scheduled at this time.   AS, CMA

## 2020-12-12 ENCOUNTER — Ambulatory Visit: Payer: 59 | Admitting: Physician Assistant

## 2020-12-18 ENCOUNTER — Other Ambulatory Visit: Payer: Self-pay | Admitting: Physician Assistant

## 2021-01-15 ENCOUNTER — Telehealth: Payer: Self-pay | Admitting: Physician Assistant

## 2021-01-15 ENCOUNTER — Other Ambulatory Visit: Payer: Self-pay | Admitting: Physician Assistant

## 2021-01-15 NOTE — Telephone Encounter (Signed)
Left voicemail for patient

## 2021-01-15 NOTE — Telephone Encounter (Signed)
Please contact patient to schedule per last AVS for med refills. AS, CMA

## 2021-02-05 ENCOUNTER — Other Ambulatory Visit: Payer: Self-pay | Admitting: Physician Assistant

## 2021-02-12 ENCOUNTER — Ambulatory Visit (INDEPENDENT_AMBULATORY_CARE_PROVIDER_SITE_OTHER): Payer: 59 | Admitting: Physician Assistant

## 2021-02-12 ENCOUNTER — Encounter: Payer: Self-pay | Admitting: Physician Assistant

## 2021-02-12 VITALS — BP 119/75 | Ht 67.0 in | Wt 170.0 lb

## 2021-02-12 DIAGNOSIS — F909 Attention-deficit hyperactivity disorder, unspecified type: Secondary | ICD-10-CM | POA: Diagnosis not present

## 2021-02-12 DIAGNOSIS — F5104 Psychophysiologic insomnia: Secondary | ICD-10-CM

## 2021-02-12 DIAGNOSIS — F411 Generalized anxiety disorder: Secondary | ICD-10-CM | POA: Diagnosis not present

## 2021-02-12 MED ORDER — VENLAFAXINE HCL ER 37.5 MG PO CP24: ORAL_CAPSULE | ORAL | 0 refills | Status: DC

## 2021-02-12 MED ORDER — DULOXETINE HCL 20 MG PO CPEP: 20.0000 mg | ORAL_CAPSULE | Freq: Every day | ORAL | 0 refills | Status: DC

## 2021-02-12 MED ORDER — ZOLPIDEM TARTRATE ER 12.5 MG PO TBCR: 12.5000 mg | EXTENDED_RELEASE_TABLET | Freq: Every evening | ORAL | 0 refills | Status: DC | PRN

## 2021-02-12 MED ORDER — AMPHETAMINE-DEXTROAMPHETAMINE 30 MG PO TABS: 30.0000 mg | ORAL_TABLET | Freq: Two times a day (BID) | ORAL | 0 refills | Status: DC

## 2021-02-12 NOTE — Progress Notes (Signed)
Telehealth office visit note for Abigail Reid, PA-C- at Primary Care at [facility]   I connected with current patient today by telephone and verified that I am speaking with the correct person    Location of the patient: Home  Location of the provider: Office - This visit type was conducted due to national recommendations for restrictions regarding the COVID-19 Pandemic (e.g. social distancing) in an effort to limit this patient's exposure and mitigate transmission in our community.    - No physical exam could be performed with this format, beyond that communicated to [place] by the patient/ family members as noted.   - Additionally my office staff/ schedulers were to discuss with the patient that there may be a monetary charge related to this service, depending on their medical insurance.  My understanding is that patient understood and consented to proceed.     _________________________________________________________________________________   History of Present Illness: Patient calls in to follow-up on ADHD and mood management.  Patient reports enrolled in a mental health monthly membership for therapy and medication management. However, cancelled her membership because it was not as helpful as she expected. Patient reports increased anxiety and feels like medication is not as effective as before. In the past has tried several other medications including Zoloft and Celexa.   Insomnia: Reports has been out of Lorrin Mais so has been having more trouble with sleep. In the past tried Trazodone which made her feel like a zombie and had a weird sensation. Has also tried OTC meds such as melatonin and benadryl.      GAD 7 : Generalized Anxiety Score 02/12/2021 06/04/2020 01/08/2020  Nervous, Anxious, on Edge 3 2 3   Control/stop worrying 1 1 1   Worry too much - different things 1 1 1   Trouble relaxing 1 1 3   Restless 1 1 2   Easily annoyed or irritable 0 0 0  Afraid - awful might happen 1 1 0   Total GAD 7 Score 8 7 10   Anxiety Difficulty Somewhat difficult Very difficult Somewhat difficult    Depression screen [facility] 2/9 02/12/2021 06/04/2020 01/08/2020 10/06/2018 05/12/2018  Decreased Interest 0 0 0 0 0  Down, Depressed, Hopeless 0 0 0 1 0  PHQ - 2 Score 0 0 0 1 0  Altered sleeping 3 3 3 3 2   Tired, decreased energy 2 3 3 1 1   Change in appetite 0 0 0 2 1  Feeling bad or failure about yourself  0 0 0 0 0  Trouble concentrating 3 1 3 2  0  Moving slowly or fidgety/restless 0 1 0 0 0  Suicidal thoughts 0 0 0 - 0  PHQ-9 Score 8 8 9 9 4   Difficult doing work/chores Somewhat difficult Somewhat difficult Somewhat difficult Somewhat difficult Not difficult at all      Impression and Recommendations:     1. GAD (generalized anxiety disorder)   2. Attention deficit hyperactivity disorder (ADHD), unspecified ADHD type   3. Psychophysiological insomnia     ADHD: -PDMP reviewed, Adderall 20 mg last filled 12/21/2020 q: 60 tabs 30 day supply prescriber Marguerite Olea, NP (provider with [facility]). -Discussed with patient controlled substance policy including breach in contract. Advised for future reference to notify the clinic of mental health subscriptions that include medication management.  -Will provide refill of current medication regimen. -Follow up in 3 months for medication management and prefer in-person visit.   GAD: -GAD-7 score of 8. -Discussed with patient treatment adjustments  including increasing Venlafaxine to 225 mg or changing to different SSRI. Patient willing to change to duloxetine. Discussed tapering instructions and advised to monitor for withdrawal symptoms. -Advised to let me know if unable to tolerate new medication. -Follow up in 3 months to reassess symptoms and medication therapy.  Psychophysiological insomnia: -Patient has failed multiple treatment options. -PDMP reviewed, zolpidem 12.5 mg last filled 08/13/2020 Q: 30 tabs for 30 days. Provided  refill. -Recommend to use the Calm app. -Will continue to monitor.   - As part of my medical decision making, I reviewed the following data within the [place] History obtained from pt /family, CMA notes reviewed and incorporated if applicable, Labs reviewed, Radiograph/ tests reviewed if applicable and OV notes from prior OV's with me, as well as any other specialists she/he has seen since seeing me last, were all reviewed and used in my medical decision making process today.    - Additionally, when appropriate, discussion had with patient regarding our treatment plan, and their biases/concerns about that plan were used in my medical decision making today.    - The patient agreed with the plan and demonstrated an understanding of the instructions.   No barriers to understanding were identified.     - The patient was advised to call back or seek an in-person evaluation if the symptoms worsen or if the condition fails to improve as anticipated.   Return in about 3 months (around 05/15/2021) for Mood- changed med, ADHD, insomnia (advised to schedule in-person).    No orders of the defined types were placed in this encounter.   Meds ordered this encounter  Medications   DULoxetine (CYMBALTA) 20 MG capsule    Sig: Take 1 capsule (20 mg total) by mouth daily.    Dispense:  90 capsule    Refill:  0    Order Specific Question:   Supervising Provider    Answer:   Beatrice Lecher D [[id number]]   zolpidem (AMBIEN CR) 12.5 MG CR tablet    Sig: Take 1 tablet (12.5 mg total) by mouth at bedtime as needed for sleep.    Dispense:  30 tablet    Refill:  0    Order Specific Question:   Supervising Provider    Answer:   Beatrice Lecher D [[id number]]   amphetamine-dextroamphetamine (ADDERALL) 30 MG tablet    Sig: Take 1 tablet by mouth 2 (two) times daily.    Dispense:  60 tablet    Refill:  0    Order Specific Question:   Supervising Provider    Answer:   Beatrice Lecher D  [[id number]]   venlafaxine XR (EFFEXOR XR) 37.5 MG 24 hr capsule    Sig: Take 4 capsules by mouth daily x 7 days. Take 3 capsules by mouth daily x 7 days. Take 2 capsules by mouth daily x 7 days. Take 1 capsule by mouth daily x 7 days. Take 1 capsule by mouth every other day x 7 days. Take 1 capsule by mouth every third day x 7 days, then discontinue.    Dispense:  75 capsule    Refill:  0    Order Specific Question:   Supervising Provider    Answer:   Beatrice Lecher D [[id number]]    Medications Discontinued During This Encounter  Medication Reason   hydrOXYzine (ATARAX/VISTARIL) 50 MG tablet Patient Preference   venlafaxine XR (EFFEXOR-XR) 150 MG 24 hr capsule Change in therapy   amphetamine-dextroamphetamine (ADDERALL) 30 MG tablet Reorder  zolpidem (AMBIEN CR) 12.5 MG CR tablet Reorder       Time spent on visit including pre-visit chart review and post-visit care was 15 minutes.   Note:  This note was prepared with assistance of Dragon voice recognition software. Occasional wrong-word or sound-a-like substitutions may have occurred due to the inherent limitations of voice recognition software.    The [place] was signed into law in 2016 which includes the topic of electronic health records.  This provides immediate access to information in MyChart.  This includes consultation notes, operative notes, office notes, lab results and pathology reports.  If you have any questions about what you read please let us know at your next visit or call us at the office.  We are right here with you.   __________________________________________________________________________________     Patient Care Team    Relationship Specialty Notifications Start End  Abigail Smith, [place] PCP - General Physician Assistant  02/02/20   Osborne Oman, MD Attending Physician Obstetrics and Gynecology  01/19/18      -Vitals obtained; medications/ allergies reconciled;  personal medical, social,  Sx etc.histories were updated by CMA, reviewed by me and are reflected in chart   Patient Active Problem List   Diagnosis Date Noted   GAD (generalized anxiety disorder) 10/06/2018   Supervision of high risk pregnancy, antepartum 02/17/2018   AMA (advanced maternal age) primigravida 44+, first trimester 02/17/2018   Missed abortion with fetal demise before 20 completed weeks of gestation 02/17/2018   Healthcare maintenance 01/19/2018   ADHD (attention deficit hyperactivity disorder) 01/19/2018   Insomnia 01/19/2018     Current Meds  Medication Sig   DULoxetine (CYMBALTA) 20 MG capsule Take 1 capsule (20 mg total) by mouth daily.   magnesium oxide (MAG-OX) 400 MG tablet Take 400 mg by mouth daily.   venlafaxine XR (EFFEXOR XR) 37.5 MG 24 hr capsule Take 4 capsules by mouth daily x 7 days. Take 3 capsules by mouth daily x 7 days. Take 2 capsules by mouth daily x 7 days. Take 1 capsule by mouth daily x 7 days. Take 1 capsule by mouth every other day x 7 days. Take 1 capsule by mouth every third day x 7 days, then discontinue.   [DISCONTINUED] venlafaxine XR (EFFEXOR-XR) 150 MG 24 hr capsule TAKE 1 CAPSULE (150 MG TOTAL) BY MOUTH DAILY WITH BREAKFAST. **NEEDS APT FOR REFILLS**   [DISCONTINUED] zolpidem (AMBIEN CR) 12.5 MG CR tablet Take 1 tablet (12.5 mg total) by mouth at bedtime as needed for sleep.     Allergies:  Allergies  Allergen Reactions   Vicodin [Hydrocodone-Acetaminophen] Hives   Vancomycin Other (See Comments)    Red man syndrome     ROS:  See above HPI for pertinent positives and negatives   Objective:   Blood pressure 119/75, height 5\' 7"  (1.702 m), weight 170 lb (77.1 kg), last menstrual period 02/08/2021.  (if some vitals are omitted, this means that patient was UNABLE to obtain them.) General: A & O * 3; sounds in no acute distress Respiratory: speaking in full sentences, no conversational dyspnea Psych: insight appears good, mood- appears full

## 2021-03-06 ENCOUNTER — Other Ambulatory Visit: Payer: Self-pay | Admitting: Physician Assistant

## 2021-03-06 DIAGNOSIS — F411 Generalized anxiety disorder: Secondary | ICD-10-CM

## 2021-03-11 ENCOUNTER — Other Ambulatory Visit: Payer: Self-pay | Admitting: Physician Assistant

## 2021-03-11 ENCOUNTER — Other Ambulatory Visit: Payer: Self-pay | Admitting: Nurse Practitioner

## 2021-03-11 DIAGNOSIS — F5104 Psychophysiologic insomnia: Secondary | ICD-10-CM

## 2021-03-11 DIAGNOSIS — F909 Attention-deficit hyperactivity disorder, unspecified type: Secondary | ICD-10-CM

## 2021-03-11 MED ORDER — AMPHETAMINE-DEXTROAMPHETAMINE 30 MG PO TABS: 30.0000 mg | ORAL_TABLET | Freq: Two times a day (BID) | ORAL | 0 refills | Status: DC

## 2021-03-11 NOTE — Telephone Encounter (Signed)
Patient saw maritza in 02/2021. Reviewed note and patient advised to follow up three months. I filled meds for next 30 days. Sent to [facility].

## 2021-03-11 NOTE — Progress Notes (Signed)
Patient saw maritza in 02/2021. Reviewed note and patient advised to follow up three months. I filled meds for next 30 days. Sent to [facility].

## 2021-03-11 NOTE — Telephone Encounter (Signed)
Patient had visit with Herb Grays 02/2021 and advised to follow up 3 months.

## 2021-03-20 ENCOUNTER — Telehealth: Payer: Self-pay | Admitting: Physician Assistant

## 2021-03-20 NOTE — Telephone Encounter (Signed)
Error

## 2021-04-13 ENCOUNTER — Other Ambulatory Visit: Payer: Self-pay | Admitting: Nurse Practitioner

## 2021-04-13 ENCOUNTER — Other Ambulatory Visit: Payer: Self-pay | Admitting: Physician Assistant

## 2021-04-13 DIAGNOSIS — F5104 Psychophysiologic insomnia: Secondary | ICD-10-CM

## 2021-04-13 DIAGNOSIS — F909 Attention-deficit hyperactivity disorder, unspecified type: Secondary | ICD-10-CM

## 2021-04-14 ENCOUNTER — Other Ambulatory Visit: Payer: Self-pay | Admitting: Physician Assistant

## 2021-04-14 ENCOUNTER — Other Ambulatory Visit: Payer: Self-pay | Admitting: Nurse Practitioner

## 2021-04-14 DIAGNOSIS — F5104 Psychophysiologic insomnia: Secondary | ICD-10-CM

## 2021-04-14 DIAGNOSIS — F909 Attention-deficit hyperactivity disorder, unspecified type: Secondary | ICD-10-CM

## 2021-04-14 MED ORDER — ZOLPIDEM TARTRATE ER 12.5 MG PO TBCR: 12.5000 mg | EXTENDED_RELEASE_TABLET | Freq: Every evening | ORAL | 0 refills | Status: DC | PRN

## 2021-04-14 MED ORDER — AMPHETAMINE-DEXTROAMPHETAMINE 30 MG PO TABS: 30.0000 mg | ORAL_TABLET | Freq: Two times a day (BID) | ORAL | 0 refills | Status: DC

## 2021-04-14 NOTE — Telephone Encounter (Signed)
Patient last seen 02/12/21 and advised to follow up in 3 months.   Last refill given 03/11/21 for 30 day supply. AS, CMA

## 2021-04-15 ENCOUNTER — Encounter: Payer: Self-pay | Admitting: Physician Assistant

## 2021-05-12 ENCOUNTER — Other Ambulatory Visit: Payer: Self-pay | Admitting: Physician Assistant

## 2021-05-12 DIAGNOSIS — F909 Attention-deficit hyperactivity disorder, unspecified type: Secondary | ICD-10-CM

## 2021-05-12 DIAGNOSIS — F5104 Psychophysiologic insomnia: Secondary | ICD-10-CM

## 2021-05-12 DIAGNOSIS — F411 Generalized anxiety disorder: Secondary | ICD-10-CM

## 2021-05-22 ENCOUNTER — Ambulatory Visit (INDEPENDENT_AMBULATORY_CARE_PROVIDER_SITE_OTHER): Payer: 59 | Admitting: Physician Assistant

## 2021-05-22 ENCOUNTER — Encounter: Payer: Self-pay | Admitting: Physician Assistant

## 2021-05-22 VITALS — Ht 67.0 in | Wt 180.0 lb

## 2021-05-22 DIAGNOSIS — F411 Generalized anxiety disorder: Secondary | ICD-10-CM | POA: Diagnosis not present

## 2021-05-22 DIAGNOSIS — F909 Attention-deficit hyperactivity disorder, unspecified type: Secondary | ICD-10-CM | POA: Diagnosis not present

## 2021-05-22 DIAGNOSIS — F32A Depression, unspecified: Secondary | ICD-10-CM

## 2021-05-22 DIAGNOSIS — L209 Atopic dermatitis, unspecified: Secondary | ICD-10-CM

## 2021-05-22 DIAGNOSIS — F5104 Psychophysiologic insomnia: Secondary | ICD-10-CM

## 2021-05-22 MED ORDER — ZOLPIDEM TARTRATE 10 MG PO TABS
10.0000 mg | ORAL_TABLET | Freq: Every evening | ORAL | 0 refills | Status: DC | PRN
Start: 2021-05-22 — End: 2021-06-20

## 2021-05-22 MED ORDER — AMPHETAMINE-DEXTROAMPHETAMINE 30 MG PO TABS: 30.0000 mg | ORAL_TABLET | Freq: Two times a day (BID) | ORAL | 0 refills | Status: DC

## 2021-05-22 MED ORDER — TRIAMCINOLONE ACETONIDE 0.5 % EX OINT: 1.0000 "application " | TOPICAL_OINTMENT | Freq: Two times a day (BID) | CUTANEOUS | 0 refills | Status: DC

## 2021-05-22 MED ORDER — DULOXETINE HCL 30 MG PO CPEP: 30.0000 mg | ORAL_CAPSULE | Freq: Every day | ORAL | 0 refills | Status: DC

## 2021-05-22 NOTE — Progress Notes (Signed)
Telehealth office visit note for Abigail Reid, PA-C- at Primary Care at [facility]   I connected with current patient today by telephone and verified that I am speaking with the correct person    Location of the patient: Home  Location of the provider: Office - This visit type was conducted due to national recommendations for restrictions regarding the COVID-19 Pandemic (e.g. social distancing) in an effort to limit this patient's exposure and mitigate transmission in our community.    - No physical exam could be performed with this format, beyond that communicated to [place] by the patient/ family members as noted.   - Additionally my office staff/ schedulers were to discuss with the patient that there may be a monetary charge related to this service, depending on their medical insurance.  My understanding is that patient understood and consented to proceed.     _________________________________________________________________________________   History of Present Illness: Patient calls in to follow up on ADHD and mood management. States is getting over upper respiratory infection. Patient was changed from Effexor to Cymbalta and states medication is working better. Has noticed an improvement with anxiety. States also sleeping some better with Cymbalta and wants to trial changing Ambien CR to regular Ambien, which she was in the past. Has c/o eczema flare-up on her hands and wrist triggered by a recent change in detergents. Reports medication compliance with Adderall. No changes with concentration or focusing. Denies chest pain, palpitations,or cognition changes.      GAD 7 : Generalized Anxiety Score 05/22/2021 02/12/2021 06/04/2020 01/08/2020  Nervous, Anxious, on Edge '3 3 2 3  '$ Control/stop worrying '1 1 1 1  '$ Worry too much - different things '1 1 1 1  '$ Trouble relaxing '1 1 1 3  '$ Restless 0 '1 1 2  '$ Easily annoyed or irritable 0 0 0 0  Afraid - awful might happen 0 1 1 0  Total GAD 7 Score  '6 8 7 10  '$ Anxiety Difficulty - Somewhat difficult Very difficult Somewhat difficult    Depression screen [facility] 2/9 05/22/2021 02/12/2021 06/04/2020 01/08/2020 10/06/2018  Decreased Interest 1 0 0 0 0  Down, Depressed, Hopeless 0 0 0 0 1  PHQ - 2 Score 1 0 0 0 1  Altered sleeping '1 3 3 3 3  '$ Tired, decreased energy '1 2 3 3 1  '$ Change in appetite 1 0 0 0 2  Feeling bad or failure about yourself  0 0 0 0 0  Trouble concentrating '3 3 1 3 2  '$ Moving slowly or fidgety/restless 0 0 1 0 0  Suicidal thoughts 0 0 0 0 -  PHQ-9 Score '7 8 8 9 9  '$ Difficult doing work/chores - Somewhat difficult Somewhat difficult Somewhat difficult Somewhat difficult      Impression and Recommendations:     1. Attention deficit hyperactivity disorder (ADHD), unspecified ADHD type   2. GAD (generalized anxiety disorder)   3. Psychophysiological insomnia   4. Atopic dermatitis, unspecified type   5. Depression, unspecified depression type     ADHD: -Stable. -PDMP reviewed, Adderall 30 mg Q:60 tabs last filled 04/14/2021. Provided refill. -Advised to scheduled next visit in-person.   GAD, Depression: -GAD-7 score of 6, PHQ-9 score of 7 slight improvement from prior. Discussed with patient treatment adjustments and wants to increase Cymbalta to 30 mg. Advised to let me know if unable to tolerate medication. -Will continue to monitor and reassess medication therapy at follow up visit.  Psychophysiological insomnia: -  Will trial Ambien 10 mg and discontinue Ambien CR 12.5 mg. Advised if insomnia worsens then recommend resuming Ambien CR 12.5 mg. No aberrancies noted with PDMP review. -Patient has tried and failed melatonin, benadryl and Trazodone in the past.  Atopic dermatitis, unspecified type: -Recommend to use topical corticosteroid, moisturizer and will place referral to allergy/asthma.    - As part of my medical decision making, I reviewed the following data within the [place] History obtained  from pt /family, CMA notes reviewed and incorporated if applicable, Labs reviewed, Radiograph/ tests reviewed if applicable and OV notes from prior OV's with me, as well as any other specialists she/he has seen since seeing me last, were all reviewed and used in my medical decision making process today.    - Additionally, when appropriate, discussion had with patient regarding our treatment plan, and their biases/concerns about that plan were used in my medical decision making today.    - The patient agreed with the plan and demonstrated an understanding of the instructions.   No barriers to understanding were identified.     - The patient was advised to call back or seek an in-person evaluation if the symptoms worsen or if the condition fails to improve as anticipated.   Return in about 3 months (around 08/21/2021) for ADHD, mood, insomnia (in-person, not telehealth).    Orders Placed This Encounter  Procedures   Ambulatory referral to Allergy     Meds ordered this encounter  Medications   DULoxetine (CYMBALTA) 30 MG capsule    Sig: Take 1 capsule (30 mg total) by mouth daily.    Dispense:  90 capsule    Refill:  0    Order Specific Question:   Supervising Provider    Answer:   Beatrice Lecher D [[id number]]   amphetamine-dextroamphetamine (ADDERALL) 30 MG tablet    Sig: Take 1 tablet by mouth 2 (two) times daily.    Dispense:  60 tablet    Refill:  0    Order Specific Question:   Supervising Provider    Answer:   Beatrice Lecher D [[id number]]   zolpidem (AMBIEN) 10 MG tablet    Sig: Take 1 tablet (10 mg total) by mouth at bedtime as needed for sleep.    Dispense:  30 tablet    Refill:  0    Order Specific Question:   Supervising Provider    Answer:   Beatrice Lecher D [[id number]]   triamcinolone ointment (KENALOG) 0.5 %    Sig: Apply 1 application topically 2 (two) times daily.    Dispense:  30 g    Refill:  0    Order Specific Question:   Supervising Provider    Answer:    Beatrice Lecher D [[id number]]    Medications Discontinued During This Encounter  Medication Reason   venlafaxine XR (EFFEXOR XR) 37.5 MG 24 hr capsule Patient Preference   magnesium oxide (MAG-OX) 400 MG tablet Patient Preference   zolpidem (AMBIEN CR) 12.5 MG CR tablet Change in therapy   DULoxetine (CYMBALTA) 20 MG capsule Dose change   amphetamine-dextroamphetamine (ADDERALL) 30 MG tablet Reorder       Time spent on telephone encounter was 12 minutes.      The [place] was signed into law in 2016 which includes the topic of electronic health records.  This provides immediate access to information in MyChart.  This includes consultation notes, operative notes, office notes, lab results and pathology reports.  If  you have any questions about what you read please let us know at your next visit or call us at the office.  We are right here with you.   __________________________________________________________________________________     Patient Care Team    Relationship Specialty Notifications Start End  Abigail Smith, [place] PCP - General Physician Assistant  02/02/20   Osborne Oman, MD Attending Physician Obstetrics and Gynecology  01/19/18      -Vitals obtained; medications/ allergies reconciled;  personal medical, social, Sx etc.histories were updated by CMA, reviewed by me and are reflected in chart   Patient Active Problem List   Diagnosis Date Noted   GAD (generalized anxiety disorder) 10/06/2018   Supervision of high risk pregnancy, antepartum 02/17/2018   AMA (advanced maternal age) primigravida 46+, first trimester 02/17/2018   Missed abortion with fetal demise before 20 completed weeks of gestation 02/17/2018   Healthcare maintenance 01/19/2018   ADHD (attention deficit hyperactivity disorder) 01/19/2018   Insomnia 01/19/2018     Current Meds  Medication Sig   DULoxetine (CYMBALTA) 30 MG capsule Take 1 capsule (30 mg total) by mouth daily.    triamcinolone ointment (KENALOG) 0.5 % Apply 1 application topically 2 (two) times daily.   zolpidem (AMBIEN) 10 MG tablet Take 1 tablet (10 mg total) by mouth at bedtime as needed for sleep.   [DISCONTINUED] DULoxetine (CYMBALTA) 20 MG capsule Take 1 capsule (20 mg total) by mouth daily.   [DISCONTINUED] zolpidem (AMBIEN CR) 12.5 MG CR tablet Take 1 tablet (12.5 mg total) by mouth at bedtime as needed. for sleep     Allergies:  Allergies  Allergen Reactions   Vicodin [Hydrocodone-Acetaminophen] Hives   Vancomycin Other (See Comments)    Red man syndrome     ROS:  See above HPI for pertinent positives and negatives   Objective:   Height '5\' 7"'$  (1.702 m), weight 180 lb (81.6 kg).  (if some vitals are omitted, this means that patient was UNABLE to obtain them.) General: A & O * 3; sounds in no acute distress; sounds congested Respiratory: speaking in full sentences, no conversational dyspnea Psych: insight appears good, mood- appears full

## 2021-06-20 ENCOUNTER — Other Ambulatory Visit: Payer: Self-pay | Admitting: Physician Assistant

## 2021-06-20 DIAGNOSIS — F5104 Psychophysiologic insomnia: Secondary | ICD-10-CM

## 2021-06-20 DIAGNOSIS — F909 Attention-deficit hyperactivity disorder, unspecified type: Secondary | ICD-10-CM

## 2021-06-23 MED ORDER — AMPHETAMINE-DEXTROAMPHETAMINE 30 MG PO TABS: 30.0000 mg | ORAL_TABLET | Freq: Two times a day (BID) | ORAL | 0 refills | Status: DC

## 2021-06-23 MED ORDER — ZOLPIDEM TARTRATE 10 MG PO TABS: 10.0000 mg | ORAL_TABLET | Freq: Every evening | ORAL | 0 refills | Status: DC | PRN

## 2021-07-22 ENCOUNTER — Other Ambulatory Visit: Payer: Self-pay | Admitting: Physician Assistant

## 2021-07-22 DIAGNOSIS — F5104 Psychophysiologic insomnia: Secondary | ICD-10-CM

## 2021-07-25 ENCOUNTER — Other Ambulatory Visit: Payer: Self-pay | Admitting: Physician Assistant

## 2021-07-25 DIAGNOSIS — F411 Generalized anxiety disorder: Secondary | ICD-10-CM

## 2021-07-25 DIAGNOSIS — F909 Attention-deficit hyperactivity disorder, unspecified type: Secondary | ICD-10-CM

## 2021-07-25 MED ORDER — DULOXETINE HCL 30 MG PO CPEP: 30.0000 mg | ORAL_CAPSULE | Freq: Every day | ORAL | 0 refills | Status: DC

## 2021-07-25 MED ORDER — AMPHETAMINE-DEXTROAMPHETAMINE 30 MG PO TABS: 30.0000 mg | ORAL_TABLET | Freq: Two times a day (BID) | ORAL | 0 refills | Status: DC

## 2021-08-07 ENCOUNTER — Ambulatory Visit: Payer: 59 | Admitting: Allergy

## 2021-08-08 ENCOUNTER — Telehealth (HOSPITAL_BASED_OUTPATIENT_CLINIC_OR_DEPARTMENT_OTHER): Payer: Self-pay

## 2021-08-11 ENCOUNTER — Encounter (HOSPITAL_BASED_OUTPATIENT_CLINIC_OR_DEPARTMENT_OTHER): Payer: 59

## 2021-08-11 DIAGNOSIS — Z1231 Encounter for screening mammogram for malignant neoplasm of breast: Secondary | ICD-10-CM

## 2021-09-03 ENCOUNTER — Other Ambulatory Visit: Payer: Self-pay

## 2021-09-03 ENCOUNTER — Encounter: Payer: Self-pay | Admitting: Physician Assistant

## 2021-09-03 ENCOUNTER — Ambulatory Visit (INDEPENDENT_AMBULATORY_CARE_PROVIDER_SITE_OTHER): Payer: 59 | Admitting: Physician Assistant

## 2021-09-03 VITALS — BP 130/76 | HR 94 | Temp 98.2°F | Ht 67.0 in | Wt 196.0 lb

## 2021-09-03 DIAGNOSIS — F909 Attention-deficit hyperactivity disorder, unspecified type: Secondary | ICD-10-CM | POA: Diagnosis not present

## 2021-09-03 DIAGNOSIS — Z1231 Encounter for screening mammogram for malignant neoplasm of breast: Secondary | ICD-10-CM | POA: Diagnosis not present

## 2021-09-03 DIAGNOSIS — G8929 Other chronic pain: Secondary | ICD-10-CM

## 2021-09-03 DIAGNOSIS — F5104 Psychophysiologic insomnia: Secondary | ICD-10-CM

## 2021-09-03 DIAGNOSIS — M545 Low back pain, unspecified: Secondary | ICD-10-CM

## 2021-09-03 DIAGNOSIS — M542 Cervicalgia: Secondary | ICD-10-CM | POA: Diagnosis not present

## 2021-09-03 DIAGNOSIS — F411 Generalized anxiety disorder: Secondary | ICD-10-CM | POA: Diagnosis not present

## 2021-09-03 DIAGNOSIS — F339 Major depressive disorder, recurrent, unspecified: Secondary | ICD-10-CM

## 2021-09-03 MED ORDER — AMPHETAMINE-DEXTROAMPHETAMINE 30 MG PO TABS: 30.0000 mg | ORAL_TABLET | Freq: Two times a day (BID) | ORAL | 0 refills | Status: DC

## 2021-09-03 MED ORDER — ZOLPIDEM TARTRATE 10 MG PO TABS: 10.0000 mg | ORAL_TABLET | Freq: Every evening | ORAL | 0 refills | Status: DC | PRN

## 2021-09-03 MED ORDER — DULOXETINE HCL 30 MG PO CPEP: 60.0000 mg | ORAL_CAPSULE | Freq: Every day | ORAL | 0 refills | Status: DC

## 2021-09-03 NOTE — Assessment & Plan Note (Signed)
-  Discussed with patient uncontrolled anxiety likely contributing to concentration. If symptoms fail to improve or worsen then will consider medication adjustments.  Continue current medication regimen.  PDMP reviewed, no aberrancies noted.  Provided refills. -Follow-up in 3 months for medication management.

## 2021-09-03 NOTE — Progress Notes (Signed)
Established Patient Office Visit  Subjective:  Patient ID: Abigail Smith, female    DOB: 1981/01/28  Age: 40 y.o. MRN: [id number]  CC:  Chief Complaint  Patient presents with   Follow-up    ADHD    Back Pain    HPI Abigail Smith presents for follow up medication management. Patient has c/o neck and low back pain which has been chronic and going on for several years. Gets shooting pain (low back) which occurs suddenly. No numbness or tingling sensation down lower legs or arms.  Patient has tried ibuprofen, heat and cold therapy which sometimes does provide some relief. States has been doing Yoga which has helped. No recent falls or injuries. Patient reports when starting Cymbalta it helped with multiple joint point especially her hands.   ADHD: Feels like having more trouble with focusing and concentrating. Taking medication as directed without issues. No chest pain, palpitations, or appetite changes.   Mood: States has been more anxious and on edge than usual. Sometimes will go through periods where she doesn't want to leave her house because of fear.  Insomnia: Patient reports lately has been struggling more with falling asleep than usual and has needed to take Ambien every night. States anxiety is contributing to her sleep disturbance.  Patient reports limits caffeine intake, does not drink caffeine daily.  Past Medical History:  Diagnosis Date   ADHD    Anemia     Past Surgical History:  Procedure Laterality Date   BREAST REDUCTION SURGERY  2018   DILATION AND CURETTAGE OF UTERUS     EAB    Family History  Problem Relation Age of Onset   Hypertension Mother    Heart disease Brother    Cancer Maternal Grandmother        lung   Macular degeneration Maternal Grandmother    Cancer Paternal Grandmother        stomach   Alcohol abuse Paternal Grandfather    Cancer Paternal Grandfather        esophageal    Social History   Socioeconomic History   Marital status:  Married    Spouse name: Not on file   Number of children: Not on file   Years of education: Not on file   Highest education level: Not on file  Occupational History   Not on file  Tobacco Use   Smoking status: Never   Smokeless tobacco: Never  Vaping Use   Vaping Use: Never used  Substance and Sexual Activity   Alcohol use: Not Currently    Alcohol/week: 3.0 - 4.0 standard drinks    Types: 3 - 4 Glasses of wine per week   Drug use: No   Sexual activity: Yes    Birth control/protection: None  Other Topics Concern   Not on file  Social History Narrative   Not on file   Social Determinants of Health   Financial Resource Strain: Not on file  Food Insecurity: Not on file  Transportation Needs: Not on file  Physical Activity: Not on file  Stress: Not on file  Social Connections: Not on file  Intimate Partner Violence: Not on file    Outpatient Medications Prior to Visit  Medication Sig Dispense Refill   triamcinolone ointment (KENALOG) 0.5 % Apply 1 application topically 2 (two) times daily. 30 g 0   DULoxetine (CYMBALTA) 30 MG capsule Take 1 capsule (30 mg total) by mouth daily. 90 capsule 0   zolpidem (AMBIEN) 10 MG  tablet TAKE 1 TABLET BY MOUTH AT BEDTIME AS NEEDED FOR SLEEP. 30 tablet 0   amphetamine-dextroamphetamine (ADDERALL) 30 MG tablet Take 1 tablet by mouth 2 (two) times daily. 60 tablet 0   No facility-administered medications prior to visit.    Allergies  Allergen Reactions   Vicodin [Hydrocodone-Acetaminophen] Hives   Vancomycin Other (See Comments)    Red man syndrome    ROS Review of Systems Review of Systems:  A fourteen system review of systems was performed and found to be positive as per HPI.   Objective:    Physical Exam General:  Well Developed, well nourished, appropriate for stated age.  Neuro:  Alert and oriented,  extra-ocular muscles intact  HEENT:  Normocephalic, atraumatic, neck supple  Skin:  no gross rash, warm, pink. Cardiac:   RRR, S1 S2 Respiratory:  CTA B/L w/o wheezing, crackles or rales. MSK: Good spinal and cervical ROM, tenderness of trapezius muscle, C6-C7 and bilateral low back. No step off or deformity appreciated. Vascular:  Ext warm, no cyanosis apprec.; cap RF less 2 sec. Psych:  No HI/SI, judgement and insight good, Euthymic mood. Full Affect.  BP 130/76    Pulse 94    Temp 98.2 F (36.8 C)    Ht 5\' 7"  (1.702 m)    Wt 196 lb (88.9 kg)    SpO2 98%    BMI 30.70 kg/m  Wt Readings from Last 3 Encounters:  09/03/21 196 lb (88.9 kg)  05/22/21 180 lb (81.6 kg)  02/12/21 170 lb (77.1 kg)     Health Maintenance Due  Topic Date Due   Hepatitis C Screening  Never done   PAP SMEAR-Modifier  Never done   TETANUS/TDAP  12/31/2019   COVID-19 Vaccine (3 - Booster for Pfizer series) 03/29/2020   INFLUENZA VACCINE  04/07/2021    There are no preventive care reminders to display for this patient.  Lab Results  Component Value Date   TSH 1.180 01/01/2020   Lab Results  Component Value Date   WBC 7.5 01/01/2020   HGB 12.8 01/01/2020   HCT 37.4 01/01/2020   MCV 98 (H) 01/01/2020   PLT 478 (H) 01/01/2020   Lab Results  Component Value Date   NA 135 01/01/2020   K 4.6 01/01/2020   CO2 20 01/01/2020   GLUCOSE 117 (H) 01/01/2020   BUN 9 01/01/2020   CREATININE 0.65 01/01/2020   BILITOT 0.3 01/01/2020   ALKPHOS 34 (L) 01/01/2020   AST 22 01/01/2020   ALT 29 01/01/2020   PROT 6.8 01/01/2020   ALBUMIN 4.2 01/01/2020   CALCIUM 9.1 01/01/2020   Lab Results  Component Value Date   CHOL 216 (H) 01/01/2020   Lab Results  Component Value Date   HDL 65 01/01/2020   Lab Results  Component Value Date   LDLCALC 126 (H) 01/01/2020   Lab Results  Component Value Date   TRIG 144 01/01/2020   Lab Results  Component Value Date   CHOLHDL 3.3 01/01/2020   Lab Results  Component Value Date   HGBA1C 5.5 01/01/2020   Depression screen PHQ 2/9 09/03/2021 05/22/2021 02/12/2021 06/04/2020 01/08/2020   Decreased Interest 1 1 0 0 0  Down, Depressed, Hopeless 1 0 0 0 0  PHQ - 2 Score 2 1 0 0 0  Altered sleeping 3 1 3 3 3   Tired, decreased energy 3 1 2 3 3   Change in appetite 1 1 0 0 0  Feeling bad or failure about yourself  1 0 0 0 0  Trouble concentrating 3 3 3 1 3   Moving slowly or fidgety/restless 0 0 0 1 0  Suicidal thoughts 0 0 0 0 0  PHQ-9 Score 13 7 8 8 9   Difficult doing work/chores Very difficult - Somewhat difficult Somewhat difficult Somewhat difficult   GAD 7 : Generalized Anxiety Score 09/03/2021 05/22/2021 02/12/2021 06/04/2020  Nervous, Anxious, on Edge 3 3 3 2   Control/stop worrying 3 1 1 1   Worry too much - different things 3 1 1 1   Trouble relaxing 3 1 1 1   Restless 1 0 1 1  Easily annoyed or irritable 0 0 0 0  Afraid - awful might happen 1 0 1 1  Total GAD 7 Score 14 6 8 7   Anxiety Difficulty Very difficult - Somewhat difficult Very difficult       Assessment & Plan:   Problem List Items Addressed This Visit       Other   ADHD (attention deficit hyperactivity disorder) - Primary    -Discussed with patient uncontrolled anxiety likely contributing to concentration. If symptoms fail to improve or worsen then will consider medication adjustments.  Continue current medication regimen.  PDMP reviewed, no aberrancies noted.  Provided refills. -Follow-up in 3 months for medication management.      Relevant Medications   amphetamine-dextroamphetamine (ADDERALL) 30 MG tablet   Insomnia    - Uncontrolled anxiety contributing to worsening insomnia.  Duloxetine adjusted to help improve anxiety which will likely also help with insomnia.  Continue current medication regimen.  PDMP reviewed,  Ambien last filled 07/23/2021 Q: 30 tablets.  Provided refill.  Will continue to monitor and reassess medication therapy at follow-up visit.       Relevant Medications   zolpidem (AMBIEN) 10 MG tablet   GAD (generalized anxiety disorder)   Relevant Medications   DULoxetine  (CYMBALTA) 30 MG capsule   Other Visit Diagnoses     Encounter for screening mammogram for malignant neoplasm of breast       Relevant Orders   MM Digital Screening   Cervicalgia       Relevant Orders   DG Cervical Spine Complete   Chronic bilateral low back pain without sciatica       Relevant Medications   DULoxetine (CYMBALTA) 30 MG capsule   Other Relevant Orders   DG Lumbar Spine Complete   Depression, recurrent (HCC)       Relevant Medications   DULoxetine (CYMBALTA) 30 MG capsule      GAD, Recurrent depression: -Uncontrolled. Patient denies SI/HI. Discussed with patient treatment adjustments and is agreeable to increase duloxetine to 60 mg.  Advised to start taking 2 capsules of duloxetine 30 mg and let me know if adjusted dose effective or if she is unable to tolerate higher dose.  Will continue to monitor and reassess medication therapy at follow-up visit.  Chronic bilateral low back pain without sciatica, Cervicalgia:  -Will place orders for imaging studies for further evaluation.  Recommend to continue conservative therapy with stretches and exercises, heat/ice therapy and NSAID as needed.  Increased dose of duloxetine will possibly also provide musculoskeletal pain relief.  Meds ordered this encounter  Medications   DULoxetine (CYMBALTA) 30 MG capsule    Sig: Take 2 capsules (60 mg total) by mouth daily.    Dispense:  90 capsule    Refill:  0    Order Specific Question:   Supervising Provider    Answer:   Hali Marry [  [id number]]   amphetamine-dextroamphetamine (ADDERALL) 30 MG tablet    Sig: Take 1 tablet by mouth 2 (two) times daily.    Dispense:  60 tablet    Refill:  0    Order Specific Question:   Supervising Provider    Answer:   Beatrice Lecher D [[id number]]   zolpidem (AMBIEN) 10 MG tablet    Sig: Take 1 tablet (10 mg total) by mouth at bedtime as needed. for sleep    Dispense:  30 tablet    Refill:  0    Not to exceed 5 additional fills before  12/20/2021    Order Specific Question:   Supervising Provider    Answer:   Beatrice Lecher D [[id number]]    Follow-up: Return in 3 months (on 12/02/2021) for CPE and FBW.   Note:  This note was prepared with assistance of Dragon voice recognition software. Occasional wrong-word or sound-a-like substitutions may have occurred due to the inherent limitations of voice recognition software.  Lorrene Reid, PA-C

## 2021-09-03 NOTE — Assessment & Plan Note (Signed)
-   Uncontrolled anxiety contributing to worsening insomnia.  Duloxetine adjusted to help improve anxiety which will likely also help with insomnia.  Continue current medication regimen.  PDMP reviewed,  Ambien last filled 07/23/2021 Q: 30 tablets.  Provided refill.  Will continue to monitor and reassess medication therapy at follow-up visit.

## 2021-09-03 NOTE — Patient Instructions (Signed)
Managing Anxiety, Adult ?After being diagnosed with anxiety, you may be relieved to know why you have felt or behaved a certain way. You may also feel overwhelmed about the treatment ahead and what it will mean for your life. With care and support, you can manage this condition. ?How to manage lifestyle changes ?Managing stress and anxiety ?Stress is your body's reaction to life changes and events, both good and bad. Most stress will last just a few hours, but stress can be ongoing and can lead to more than just stress. Although stress can play a major role in anxiety, it is not the same as anxiety. Stress is usually caused by something external, such as a deadline, test, or competition. Stress normally passes after the triggering event has ended.  ?Anxiety is caused by something internal, such as imagining a terrible outcome or worrying that something will go wrong that will devastate you. Anxiety often does not go away even after the triggering event is over, and it can become long-term (chronic) worry. It is important to understand the differences between stress and anxiety and to manage your stress effectively so that it does not lead to an anxious response. ?Talk with your health care provider or a counselor to learn more about reducing anxiety and stress. He or she may suggest tension reduction techniques, such as: ?Music therapy. Spend time creating or listening to music that you enjoy and that inspires you. ?Mindfulness-based meditation. Practice being aware of your normal breaths while not trying to control your breathing. It can be done while sitting or walking. ?Centering prayer. This involves focusing on a word, phrase, or sacred image that means something to you and brings you peace. ?Deep breathing. To do this, expand your stomach and inhale slowly through your nose. Hold your breath for 3-5 seconds. Then exhale slowly, letting your stomach muscles relax. ?Self-talk. Learn to notice and identify  thought patterns that lead to anxiety reactions and change those patterns to thoughts that feel peaceful. ?Muscle relaxation. Taking time to tense muscles and then relax them. ?Choose a tension reduction technique that fits your lifestyle and personality. These techniques take time and practice. Set aside 5-15 minutes a day to do them. Therapists can offer counseling and training in these techniques. The training to help with anxiety may be covered by some insurance plans. ?Other things you can do to manage stress and anxiety include: ?Keeping a stress diary. This can help you learn what triggers your reaction and then learn ways to manage your response. ?Thinking about how you react to certain situations. You may not be able to control everything, but you can control your response. ?Making time for activities that help you relax and not feeling guilty about spending your time in this way. ?Doing visual imagery. This involves imagining or creating mental pictures to help you relax. ?Practicing yoga. Through yoga poses, you can lower tension and promote relaxation. ? ?Medicines ?Medicines can help ease symptoms. Medicines for anxiety include: ?Antidepressant medicines. These are usually prescribed for long-term daily control. ?Anti-anxiety medicines. These may be added in severe cases, especially when panic attacks occur. ?Medicines will be prescribed by a health care provider. When used together, medicines, psychotherapy, and tension reduction techniques may be the most effective treatment. ?Relationships ?Relationships can play a big part in helping you recover. Try to spend more time connecting with trusted friends and family members. ?Consider going to couples counseling if you have a partner, taking family education classes, or going to family   therapy. ?Therapy can help you and others better understand your condition. ?How to recognize changes in your anxiety ?Everyone responds differently to treatment for  anxiety. Recovery from anxiety happens when symptoms decrease and stop interfering with your daily activities at home or work. This may mean that you will start to: ?Have better concentration and focus. Worry will interfere less in your daily thinking. ?Sleep better. ?Be less irritable. ?Have more energy. ?Have improved memory. ?It is also important to recognize when your condition is getting worse. Contact your health care provider if your symptoms interfere with home or work and you feel like your condition is not improving. ?Follow these instructions at home: ?Activity ?Exercise. Adults should do the following: ?Exercise for at least 150 minutes each week. The exercise should increase your heart rate and make you sweat (moderate-intensity exercise). ?Strengthening exercises at least twice a week. ?Get the right amount and quality of sleep. Most adults need 7-9 hours of sleep each night. ?Lifestyle ? ?Eat a healthy diet that includes plenty of vegetables, fruits, whole grains, low-fat dairy products, and lean protein. ?Do not eat a lot of foods that are high in fats, added sugars, or salt (sodium). ?Make choices that simplify your life. ?Do not use any products that contain nicotine or tobacco. These products include cigarettes, chewing tobacco, and vaping devices, such as e-cigarettes. If you need help quitting, ask your health care provider. ?Avoid caffeine, alcohol, and certain over-the-counter cold medicines. These may make you feel worse. Ask your pharmacist which medicines to avoid. ?General instructions ?Take over-the-counter and prescription medicines only as told by your health care provider. ?Keep all follow-up visits. This is important. ?Where to find support ?You can get help and support from these sources: ?Self-help groups. ?Online and community organizations. ?A trusted spiritual leader. ?Couples counseling. ?Family education classes. ?Family therapy. ?Where to find more information ?You may find  that joining a support group helps you deal with your anxiety. The following sources can help you locate counselors or support groups near you: ?Mental Health [place]: www.mentalhealthamerica.net ?Anxiety and Depression Association of America (ADAA): www.adaa.org ?National Alliance on Mental Illness (NAMI): www.nami.org ?Contact a health care provider if: ?You have a hard time staying focused or finishing daily tasks. ?You spend many hours a day feeling worried about everyday life. ?You become exhausted by worry. ?You start to have headaches or frequently feel tense. ?You develop chronic nausea or diarrhea. ?Get help right away if: ?You have a racing heart and shortness of breath. ?You have thoughts of hurting yourself or others. ?If you ever feel like you may hurt yourself or others, or have thoughts about taking your own life, get help right away. Go to your nearest emergency department or: ?Call your local emergency services (911 in the [place] ?Call a suicide crisis helpline, such as the National Suicide Prevention Lifeline at 1-800-273-8255 or 988 in the [place] This is open 24 hours a day in the [place] ?Text the Crisis Text Line at 741741 (in the [place] ?Summary ?Taking steps to learn and use tension reduction techniques can help calm you and help prevent triggering an anxiety reaction. ?When used together, medicines, psychotherapy, and tension reduction techniques may be the most effective treatment. ?Family, friends, and partners can play a big part in supporting you. ?This information is not intended to replace advice given to you by your health care provider. Make sure you discuss any questions you have with your health care provider. ?Document Revised: 03/19/2021 Document Reviewed: 12/15/2020 ?Elsevier Patient   Education ? 2022 [employer] ? ?

## 2021-09-05 ENCOUNTER — Other Ambulatory Visit: Payer: Self-pay | Admitting: Physician Assistant

## 2021-09-05 DIAGNOSIS — L209 Atopic dermatitis, unspecified: Secondary | ICD-10-CM

## 2021-10-05 ENCOUNTER — Other Ambulatory Visit: Payer: Self-pay | Admitting: Physician Assistant

## 2021-10-05 DIAGNOSIS — F5104 Psychophysiologic insomnia: Secondary | ICD-10-CM

## 2021-10-05 DIAGNOSIS — F909 Attention-deficit hyperactivity disorder, unspecified type: Secondary | ICD-10-CM

## 2021-10-06 MED ORDER — ZOLPIDEM TARTRATE 10 MG PO TABS: 10.0000 mg | ORAL_TABLET | Freq: Every evening | ORAL | 0 refills | Status: DC | PRN

## 2021-10-06 MED ORDER — AMPHETAMINE-DEXTROAMPHETAMINE 30 MG PO TABS: 30.0000 mg | ORAL_TABLET | Freq: Two times a day (BID) | ORAL | 0 refills | Status: DC

## 2021-11-06 ENCOUNTER — Other Ambulatory Visit: Payer: Self-pay | Admitting: Physician Assistant

## 2021-11-06 DIAGNOSIS — F5104 Psychophysiologic insomnia: Secondary | ICD-10-CM

## 2021-11-06 DIAGNOSIS — F909 Attention-deficit hyperactivity disorder, unspecified type: Secondary | ICD-10-CM

## 2021-11-07 MED ORDER — ZOLPIDEM TARTRATE 10 MG PO TABS: 10.0000 mg | ORAL_TABLET | Freq: Every evening | ORAL | 0 refills | Status: DC | PRN

## 2021-11-07 MED ORDER — AMPHETAMINE-DEXTROAMPHETAMINE 30 MG PO TABS: 30.0000 mg | ORAL_TABLET | Freq: Two times a day (BID) | ORAL | 0 refills | Status: DC

## 2021-11-12 ENCOUNTER — Other Ambulatory Visit: Payer: Self-pay | Admitting: Physician Assistant

## 2021-11-12 DIAGNOSIS — F411 Generalized anxiety disorder: Secondary | ICD-10-CM

## 2021-11-13 ENCOUNTER — Encounter: Payer: Self-pay | Admitting: Physician Assistant

## 2021-11-13 DIAGNOSIS — F411 Generalized anxiety disorder: Secondary | ICD-10-CM

## 2021-11-13 MED ORDER — DULOXETINE HCL 30 MG PO CPEP: 60.0000 mg | ORAL_CAPSULE | Freq: Every day | ORAL | 0 refills | Status: DC

## 2021-11-14 ENCOUNTER — Other Ambulatory Visit: Payer: Self-pay | Admitting: Nurse Practitioner

## 2021-11-14 DIAGNOSIS — F411 Generalized anxiety disorder: Secondary | ICD-10-CM

## 2021-11-14 MED ORDER — DULOXETINE HCL 30 MG PO CPEP: 60.0000 mg | ORAL_CAPSULE | Freq: Two times a day (BID) | ORAL | 0 refills | Status: DC

## 2021-11-14 NOTE — Progress Notes (Signed)
Adjusted dosing to duloxetine 60 mg BID and sent new prescription to [facility] [address].  ?

## 2021-11-19 MED ORDER — DULOXETINE HCL 30 MG PO CPEP: 30.0000 mg | ORAL_CAPSULE | Freq: Every day | ORAL | 1 refills | Status: DC

## 2021-11-19 MED ORDER — DULOXETINE HCL 60 MG PO CPEP: 60.0000 mg | ORAL_CAPSULE | Freq: Every day | ORAL | 1 refills | Status: DC

## 2021-11-19 NOTE — Telephone Encounter (Signed)
Then forgot to change dosing instructions when I sent new dose. I meant to send '60mg'$  1 capsule BID, but sounds like the pharmacy messed up no matter what the instructions or dosing was.

## 2021-11-19 NOTE — Telephone Encounter (Signed)
I apologize to both of you. I misunderstood what she was asking for.

## 2021-11-24 ENCOUNTER — Encounter: Payer: Self-pay | Admitting: Physician Assistant

## 2021-11-26 ENCOUNTER — Other Ambulatory Visit (HOSPITAL_BASED_OUTPATIENT_CLINIC_OR_DEPARTMENT_OTHER): Payer: Self-pay | Admitting: Physician Assistant

## 2021-11-26 DIAGNOSIS — Z1231 Encounter for screening mammogram for malignant neoplasm of breast: Secondary | ICD-10-CM

## 2021-12-01 NOTE — Progress Notes (Signed)
? ?Complete physical exam ? ? ?Patient: Abigail Smith   DOB: 01-04-1981   41 y.o. Female  MRN: [id number] ?Visit Date: 12/02/2021 ? ? ?Chief Complaint  ?Patient presents with  ? Annual Exam  ? ?Subjective  ?  ?Abigail Smith is a 41 y.o. female who presents today for a complete physical exam.  ?She reports consuming a general diet.  Exercise-yoga  She generally feels fairly well. She does not have additional problems to discuss today.  ? ? ? ?Past Medical History:  ?Diagnosis Date  ? ADHD   ? Anemia   ? ?Past Surgical History:  ?Procedure Laterality Date  ? BREAST REDUCTION SURGERY  2018  ? DILATION AND CURETTAGE OF UTERUS    ? EAB  ? ?Social History  ? ?Socioeconomic History  ? Marital status: Married  ?  Spouse name: Not on file  ? Number of children: Not on file  ? Years of education: Not on file  ? Highest education level: Not on file  ?Occupational History  ? Not on file  ?Tobacco Use  ? Smoking status: Never  ? Smokeless tobacco: Never  ?Vaping Use  ? Vaping Use: Never used  ?Substance and Sexual Activity  ? Alcohol use: Not Currently  ?  Alcohol/week: 3.0 - 4.0 standard drinks  ?  Types: 3 - 4 Glasses of wine per week  ? Drug use: No  ? Sexual activity: Yes  ?  Birth control/protection: None  ?Other Topics Concern  ? Not on file  ?Social History Narrative  ? Not on file  ? ?Social Determinants of Health  ? ?Financial Resource Strain: Not on file  ?Food Insecurity: Not on file  ?Transportation Needs: Not on file  ?Physical Activity: Not on file  ?Stress: Not on file  ?Social Connections: Not on file  ?Intimate Partner Violence: Not on file  ? ? ? ?Medications: ?Outpatient Medications Prior to Visit  ?Medication Sig  ? DULoxetine (CYMBALTA) 60 MG capsule Take 1 capsule (60 mg total) by mouth daily.  ? triamcinolone ointment (KENALOG) 0.5 % APPLY TO AFFECTED AREA TWICE A DAY  ? [DISCONTINUED] amphetamine-dextroamphetamine (ADDERALL) 30 MG tablet Take 1 tablet by mouth 2 (two) times daily.  ? [DISCONTINUED] zolpidem  (AMBIEN) 10 MG tablet Take 1 tablet (10 mg total) by mouth at bedtime as needed. for sleep  ? ?No facility-administered medications prior to visit.  ? ? ?Review of Systems ?Review of Systems:  ?A fourteen system review of systems was performed and found to be positive as per HPI. ? ? ? Objective  ?  ?BP 125/79   Pulse 86   Temp 97.9 ?F (36.6 ?C)   Ht '5\' 8"'$  (1.727 m)   Wt 192 lb (87.1 kg)   LMP 11/23/2021   SpO2 98%   BMI 29.19 kg/m?  ? ? ?Physical Exam  ? ?General Appearance:    Alert, cooperative, in no acute distress, appears stated age   ?Head:    Normocephalic, without obvious abnormality, atraumatic  ?Eyes:    PERRL, conjunctiva/corneas clear, EOM's intact, fundi  ?  benign, both eyes  ?Ears:    Normal TM's and external ear canals, both ears  ?Nose:   Nares normal, septum midline, mucosa normal, no drainage  ?  or sinus tenderness  ?Throat:   Lips, mucosa, and tongue normal; teeth and gums normal  ?Neck:   Supple, symmetrical, trachea midline, no adenopathy;  ?  thyroid:  no enlargement/tenderness/nodules; no JVD  ?Back:  Symmetric, no curvature, ROM normal, no CVA tenderness, +tenderness of t-spine  ?Lungs:     Clear to auscultation bilaterally, respirations unlabored  ?Chest Wall:    No tenderness or deformity  ? Heart:    Normal heart rate. Normal rhythm. No murmurs, rubs, or gallops.  ?  ?Breast Exam:    deferred  ?Abdomen:     Soft, non-tender, bowel sounds active all four quadrants,  ?  no masses, no organomegaly  ?Pelvic:    deferred  ?Extremities:   All extremities are intact. No cyanosis or edema  ?Pulses:   2+ and symmetric all extremities  ?Skin:   Skin color, texture, turgor normal, no rashes or lesions  ?Lymph nodes:   Cervical and supraclavicular nodes normal  ?Neurologic:   CNII-XII grossly intact.  ? ? ? ?Last depression screening scores ? ?  12/02/2021  ?  3:58 PM 09/03/2021  ?  3:04 PM 05/22/2021  ?  1:12 PM  ?PHQ 2/9 Scores  ?PHQ - 2 Score '2 2 1  '$ ?PHQ- 9 Score '12 13 7  '$ ? ?Last fall  risk screening ? ?  12/02/2021  ?  3:57 PM  ?Fall Risk   ?Falls in the past year? 0  ?Number falls in past yr: 0  ?Injury with Fall? 0  ?Risk for fall due to : No Fall Risks  ?Follow up Falls evaluation completed  ? ? ? ?No results found for any visits on 12/02/21. ? Assessment & Plan  ?  ?Routine Health Maintenance and Physical Exam ? ?Exercise Activities and Dietary recommendations ?-Discussed heart healthy diet low in fat and carbohydrates. ? ?Immunization History  ?Administered Date(s) Administered  ? Influenza-Unspecified 06/20/2018  ? PFIZER(Purple Top)SARS-COV-2 Vaccination 01/12/2020, 02/02/2020  ? Tdap 12/30/2009, 12/02/2021  ? ? ?Health Maintenance  ?Topic Date Due  ? Hepatitis C Screening  Never done  ? PAP SMEAR-Modifier  Never done  ? COVID-19 Vaccine (3 - Booster for Pfizer series) 03/29/2020  ? INFLUENZA VACCINE  04/07/2021  ? TETANUS/TDAP  12/03/2031  ? HIV Screening  Completed  ? HPV VACCINES  Aged Out  ? ? ?Discussed health benefits of physical activity, and encouraged her to engage in regular exercise appropriate for her age and condition. ? ?Problem List Items Addressed This Visit   ? ?  ? Other  ? Healthcare maintenance - Primary  ? Relevant Orders  ? TSH  ? Lipid panel  ? Hemoglobin A1c  ? Comprehensive metabolic panel  ? CBC with Differential/Platelet  ? ADHD (attention deficit hyperactivity disorder)  ? Relevant Medications  ? amphetamine-dextroamphetamine (ADDERALL) 30 MG tablet (Start on 12/05/2021)  ? Insomnia  ? Relevant Medications  ? zolpidem (AMBIEN) 10 MG tablet (Start on 12/05/2021)  ? ?Other Visit Diagnoses   ? ? Need for Tdap vaccination      ? Relevant Orders  ? Tdap vaccine greater than or equal to 7yo IM (Completed)  ? Screening for endocrine, nutritional, metabolic and immunity disorder      ? Relevant Orders  ? TSH  ? Lipid panel  ? Hemoglobin A1c  ? Comprehensive metabolic panel  ? CBC with Differential/Platelet  ? ?  ? ?Patient plans to schedule female exam including pap with  her Ob/Gyn. Went for mammogram today. ?Agreeable to Tdap.  ?Advised to schedule lab visit for FBW. ?Continue adequate hydration.  ?Continue current medication regimen.  ? ? ?Return in about 3 months (around 03/04/2022) for ADHD, Mood, Insomnia; pt will call to schedule lab visit  for FBW .  ?  ? ? ? ?Lorrene Reid, PA-C  ?[facility] ?[phone] (phone) ?[phone] (fax) ? ?[facility] ?

## 2021-12-02 ENCOUNTER — Ambulatory Visit (HOSPITAL_BASED_OUTPATIENT_CLINIC_OR_DEPARTMENT_OTHER)
Admission: RE | Admit: 2021-12-02 | Discharge: 2021-12-02 | Disposition: A | Discharge status: Home / Self Care | Payer: 59 | Source: Ambulatory Visit | Attending: Physician Assistant | Admitting: Physician Assistant

## 2021-12-02 ENCOUNTER — Other Ambulatory Visit: Payer: Self-pay

## 2021-12-02 ENCOUNTER — Encounter (HOSPITAL_BASED_OUTPATIENT_CLINIC_OR_DEPARTMENT_OTHER): Payer: Self-pay

## 2021-12-02 ENCOUNTER — Encounter: Payer: Self-pay | Admitting: Physician Assistant

## 2021-12-02 ENCOUNTER — Ambulatory Visit (INDEPENDENT_AMBULATORY_CARE_PROVIDER_SITE_OTHER): Payer: 59 | Admitting: Physician Assistant

## 2021-12-02 VITALS — BP 125/79 | HR 86 | Temp 97.9°F | Ht 68.0 in | Wt 192.0 lb

## 2021-12-02 DIAGNOSIS — Z23 Encounter for immunization: Secondary | ICD-10-CM

## 2021-12-02 DIAGNOSIS — Z0000 Encounter for general adult medical examination without abnormal findings: Secondary | ICD-10-CM

## 2021-12-02 DIAGNOSIS — Z1329 Encounter for screening for other suspected endocrine disorder: Secondary | ICD-10-CM | POA: Diagnosis not present

## 2021-12-02 DIAGNOSIS — Z1321 Encounter for screening for nutritional disorder: Secondary | ICD-10-CM

## 2021-12-02 DIAGNOSIS — Z1231 Encounter for screening mammogram for malignant neoplasm of breast: Secondary | ICD-10-CM | POA: Insufficient documentation

## 2021-12-02 DIAGNOSIS — F5104 Psychophysiologic insomnia: Secondary | ICD-10-CM

## 2021-12-02 DIAGNOSIS — Z13228 Encounter for screening for other metabolic disorders: Secondary | ICD-10-CM

## 2021-12-02 DIAGNOSIS — F909 Attention-deficit hyperactivity disorder, unspecified type: Secondary | ICD-10-CM

## 2021-12-02 DIAGNOSIS — Z130 Encounter for screening for diseases of the blood and blood-forming organs and certain disorders involving the immune mechanism: Secondary | ICD-10-CM

## 2021-12-02 MED ORDER — AMPHETAMINE-DEXTROAMPHETAMINE 30 MG PO TABS
30.0000 mg | ORAL_TABLET | Freq: Two times a day (BID) | ORAL | 0 refills | Status: DC
Start: 2021-12-05 — End: 2022-01-03

## 2021-12-02 MED ORDER — ZOLPIDEM TARTRATE 10 MG PO TABS: 10.0000 mg | ORAL_TABLET | Freq: Every evening | ORAL | 0 refills | Status: DC | PRN

## 2021-12-02 NOTE — Patient Instructions (Signed)

## 2021-12-09 ENCOUNTER — Other Ambulatory Visit: Payer: Self-pay | Admitting: Physician Assistant

## 2021-12-09 DIAGNOSIS — F5104 Psychophysiologic insomnia: Secondary | ICD-10-CM

## 2021-12-12 ENCOUNTER — Encounter: Payer: Self-pay | Admitting: Physician Assistant

## 2022-01-03 ENCOUNTER — Other Ambulatory Visit: Payer: Self-pay | Admitting: Physician Assistant

## 2022-01-03 DIAGNOSIS — F909 Attention-deficit hyperactivity disorder, unspecified type: Secondary | ICD-10-CM

## 2022-01-03 DIAGNOSIS — F5104 Psychophysiologic insomnia: Secondary | ICD-10-CM

## 2022-01-05 MED ORDER — ZOLPIDEM TARTRATE 10 MG PO TABS: 10.0000 mg | ORAL_TABLET | Freq: Every evening | ORAL | 0 refills | Status: DC | PRN

## 2022-01-05 MED ORDER — AMPHETAMINE-DEXTROAMPHETAMINE 30 MG PO TABS: 30.0000 mg | ORAL_TABLET | Freq: Two times a day (BID) | ORAL | 0 refills | Status: DC

## 2022-02-03 ENCOUNTER — Other Ambulatory Visit: Payer: Self-pay | Admitting: Physician Assistant

## 2022-02-03 DIAGNOSIS — F909 Attention-deficit hyperactivity disorder, unspecified type: Secondary | ICD-10-CM

## 2022-02-03 DIAGNOSIS — F5104 Psychophysiologic insomnia: Secondary | ICD-10-CM

## 2022-02-04 ENCOUNTER — Other Ambulatory Visit: Payer: Self-pay | Admitting: Physician Assistant

## 2022-02-04 DIAGNOSIS — F5104 Psychophysiologic insomnia: Secondary | ICD-10-CM

## 2022-02-04 DIAGNOSIS — F909 Attention-deficit hyperactivity disorder, unspecified type: Secondary | ICD-10-CM

## 2022-02-05 MED ORDER — AMPHETAMINE-DEXTROAMPHETAMINE 30 MG PO TABS: 30.0000 mg | ORAL_TABLET | Freq: Two times a day (BID) | ORAL | 0 refills | Status: DC

## 2022-02-05 MED ORDER — ZOLPIDEM TARTRATE 10 MG PO TABS: 10.0000 mg | ORAL_TABLET | Freq: Every evening | ORAL | 0 refills | Status: DC | PRN

## 2022-02-13 ENCOUNTER — Encounter: Payer: Self-pay | Admitting: Physician Assistant

## 2022-02-13 DIAGNOSIS — F909 Attention-deficit hyperactivity disorder, unspecified type: Secondary | ICD-10-CM

## 2022-02-16 MED ORDER — AMPHETAMINE-DEXTROAMPHETAMINE 30 MG PO TABS
30.0000 mg | ORAL_TABLET | Freq: Two times a day (BID) | ORAL | 0 refills | Status: DC
Start: 2022-02-16 — End: 2022-03-16

## 2022-03-04 ENCOUNTER — Encounter: Payer: Self-pay | Admitting: Physician Assistant

## 2022-03-04 ENCOUNTER — Ambulatory Visit (INDEPENDENT_AMBULATORY_CARE_PROVIDER_SITE_OTHER): Payer: 59 | Admitting: Physician Assistant

## 2022-03-04 VITALS — HR 89 | Ht 68.0 in | Wt 181.0 lb

## 2022-03-04 DIAGNOSIS — L209 Atopic dermatitis, unspecified: Secondary | ICD-10-CM | POA: Diagnosis not present

## 2022-03-04 DIAGNOSIS — F411 Generalized anxiety disorder: Secondary | ICD-10-CM | POA: Diagnosis not present

## 2022-03-04 DIAGNOSIS — M2550 Pain in unspecified joint: Secondary | ICD-10-CM

## 2022-03-04 DIAGNOSIS — F909 Attention-deficit hyperactivity disorder, unspecified type: Secondary | ICD-10-CM

## 2022-03-04 MED ORDER — MELOXICAM 15 MG PO TABS: 15.0000 mg | ORAL_TABLET | Freq: Every day | ORAL | 0 refills | Status: DC | PRN

## 2022-03-04 MED ORDER — TRIAMCINOLONE ACETONIDE 0.5 % EX OINT: TOPICAL_OINTMENT | CUTANEOUS | 0 refills | Status: DC

## 2022-03-04 MED ORDER — DULOXETINE HCL 60 MG PO CPEP: 60.0000 mg | ORAL_CAPSULE | Freq: Two times a day (BID) | ORAL | 0 refills | Status: DC

## 2022-03-04 NOTE — Progress Notes (Signed)
Telehealth office visit note for Abigail Reid, PA-C- at Primary Care at [facility]   I connected with current patient today by telephone and verified that I am speaking with the correct person    Location of the patient: Home  Location of the provider: Office - This visit type was conducted due to national recommendations for restrictions regarding the COVID-19 Pandemic (e.g. social distancing) in an effort to limit this patient's exposure and mitigate transmission in our community.    - No physical exam could be performed with this format, beyond that communicated to [place] by the patient/ family members as noted.   - Additionally my office staff/ schedulers were to discuss with the patient that there may be a monetary charge related to this service, depending on their medical insurance.  My understanding is that patient understood and consented to proceed.     _________________________________________________________________________________   History of Present Illness: Patient calls in to follow-up on mood, ADHD and insomnia. Patient reports has been feeling more anxious. Denies increased stress from baseline. Taking Duloxetine 60 mg daily as directed without issues. In the past has seen a therapist but not currently. Patient states sleep has been the same, no changes. Takes Ambien 10 mg most nights. Taking Adderall 30 mg BID which continues to help with focus and concentration. No chest pain, palpitations or worsening sleep. Patient reports in the past was on Mobic 15 mg to help with joint pain which was effective.        03/04/2022    2:48 PM 12/02/2021    3:59 PM 09/03/2021    3:05 PM 05/22/2021    1:13 PM  GAD 7 : Generalized Anxiety Score  Nervous, Anxious, on Edge '2 3 3 3  '$ Control/stop worrying '2 3 3 1  '$ Worry too much - different things 0 '3 3 1  '$ Trouble relaxing '2 3 3 1  '$ Restless '2 1 1 '$ 0  Easily annoyed or irritable 0 0 0 0  Afraid - awful might happen '2 3 1 '$ 0  Total  GAD 7 Score '10 16 14 6  '$ Anxiety Difficulty Extremely difficult Extremely difficult Very difficult        03/04/2022    2:46 PM 12/02/2021    3:58 PM 09/03/2021    3:04 PM 05/22/2021    1:12 PM 02/12/2021    1:35 PM  Depression screen PHQ 2/9  Decreased Interest 0 '1 1 1 '$ 0  Down, Depressed, Hopeless '1 1 1 '$ 0 0  PHQ - 2 Score '1 2 2 1 '$ 0  Altered sleeping '3 3 3 1 3  '$ Tired, decreased energy '3 1 3 1 2  '$ Change in appetite 0 '3 1 1 '$ 0  Feeling bad or failure about yourself  0 0 1 0 0  Trouble concentrating '2 3 3 3 3  '$ Moving slowly or fidgety/restless 1 0 0 0 0  Suicidal thoughts 0 0 0 0 0  PHQ-9 Score '10 12 13 7 8  '$ Difficult doing work/chores Extremely dIfficult Extremely dIfficult Very difficult  Somewhat difficult      Impression and Recommendations:     1. GAD (generalized anxiety disorder)   2. Attention deficit hyperactivity disorder (ADHD), unspecified ADHD type   3. Atopic dermatitis, unspecified type   4. Multiple joint pain     GAD: -Discussed with patient increasing Duloxetine 60 mg to twice daily to help improve anxiety. Patient agreeable. Discussed if anxiety improves and remains stable then we consider  resuming 60 mg dose once daily. Pt verbalized understanding. Recommend to resume [facility] therapy sessions. Will continue to monitor.  ADHD: -Stable. -PDMP reviewed, no aberrancies noted. Not due for refill yet. -Follow-up in 3 months for medication management.  Atopic dermatitis: -Will provide refill of triamcinolone ointment for eczema flare-ups.   Multiple joint pain: -Discussed with patient adverse reactions with NSAIDs and recommend to take medication (Meloxicam 15 mg) once a day as needed. Patient verbalized understanding.     - As part of my medical decision making, I reviewed the following data within the [place] History obtained from pt /family, CMA notes reviewed and incorporated if applicable, Labs reviewed, Radiograph/ tests reviewed if  applicable and OV notes from prior OV's with me, as well as any other specialists she/he has seen since seeing me last, were all reviewed and used in my medical decision making process today.    - Additionally, when appropriate, discussion had with patient regarding our treatment plan, and their biases/concerns about that plan were used in my medical decision making today.    - The patient agreed with the plan and demonstrated an understanding of the instructions.   No barriers to understanding were identified.     - The patient was advised to call back or seek an in-person evaluation if the symptoms worsen or if the condition fails to improve as anticipated.   Return in about 3 months (around 06/04/2022) for ADHD, Mood.    No orders of the defined types were placed in this encounter.   Meds ordered this encounter  Medications   meloxicam (MOBIC) 15 MG tablet    Sig: Take 1 tablet (15 mg total) by mouth daily as needed for pain.    Dispense:  30 tablet    Refill:  0    Order Specific Question:   Supervising Provider    Answer:   Beatrice Lecher D [[id number]]   DULoxetine (CYMBALTA) 60 MG capsule    Sig: Take 1 capsule (60 mg total) by mouth 2 (two) times daily.    Dispense:  180 capsule    Refill:  0    Order Specific Question:   Supervising Provider    Answer:   Beatrice Lecher D [[id number]]   triamcinolone ointment (KENALOG) 0.5 %    Sig: APPLY TO AFFECTED AREA TWICE A DAY    Dispense:  30 g    Refill:  0    Order Specific Question:   Supervising Provider    Answer:   Beatrice Lecher D [[id number]]    Medications Discontinued During This Encounter  Medication Reason   DULoxetine (CYMBALTA) 60 MG capsule Dose change   triamcinolone ointment (KENALOG) 0.5 % Reorder       Time spent on telephone encounter was 12 minutes.      The [place] was signed into law in 2016 which includes the topic of electronic health records.  This provides immediate access to  information in MyChart.  This includes consultation notes, operative notes, office notes, lab results and pathology reports.  If you have any questions about what you read please let us know at your next visit or call us at the office.  We are right here with you.   __________________________________________________________________________________     Patient Care Team    Relationship Specialty Notifications Start End  Abigail Smith, [place] PCP - General Physician Assistant  02/02/20   Osborne Oman, MD Attending Physician Obstetrics and Gynecology  01/19/18      -  Vitals obtained; medications/ allergies reconciled;  personal medical, social, Sx etc.histories were updated by CMA, reviewed by me and are reflected in chart   Patient Active Problem List   Diagnosis Date Noted   GAD (generalized anxiety disorder) 10/06/2018   Supervision of high risk pregnancy, antepartum 02/17/2018   AMA (advanced maternal age) primigravida 24+, first trimester 02/17/2018   Missed abortion with fetal demise before 20 completed weeks of gestation 02/17/2018   Healthcare maintenance 01/19/2018   ADHD (attention deficit hyperactivity disorder) 01/19/2018   Insomnia 01/19/2018     Current Meds  Medication Sig   amphetamine-dextroamphetamine (ADDERALL) 30 MG tablet Take 1 tablet by mouth 2 (two) times daily.   DULoxetine (CYMBALTA) 60 MG capsule Take 1 capsule (60 mg total) by mouth 2 (two) times daily.   meloxicam (MOBIC) 15 MG tablet Take 1 tablet (15 mg total) by mouth daily as needed for pain.   zolpidem (AMBIEN) 10 MG tablet Take 1 tablet (10 mg total) by mouth at bedtime as needed. for sleep   [DISCONTINUED] DULoxetine (CYMBALTA) 60 MG capsule Take 1 capsule (60 mg total) by mouth daily.   [DISCONTINUED] triamcinolone ointment (KENALOG) 0.5 % APPLY TO AFFECTED AREA TWICE A DAY     Allergies:  Allergies  Allergen Reactions   Vicodin [Hydrocodone-Acetaminophen] Hives   Vancomycin Other (See  Comments)    Red man syndrome     ROS:  See above HPI for pertinent positives and negatives   Objective:   Pulse 89, height '5\' 8"'$  (1.727 m), weight 181 lb (82.1 kg), last menstrual period 02/08/2022.  (if some vitals are omitted, this means that patient was UNABLE to obtain them.) General: A & O * 3; sounds in no acute distress Respiratory: speaking in full sentences, no conversational dyspnea Psych: insight appears good, mood- appears full

## 2022-03-04 NOTE — Patient Instructions (Signed)

## 2022-03-05 ENCOUNTER — Other Ambulatory Visit: Payer: Self-pay | Admitting: Physician Assistant

## 2022-03-05 DIAGNOSIS — F5104 Psychophysiologic insomnia: Secondary | ICD-10-CM

## 2022-03-09 ENCOUNTER — Other Ambulatory Visit: Payer: Self-pay | Admitting: Nurse Practitioner

## 2022-03-09 ENCOUNTER — Encounter: Payer: Self-pay | Admitting: Physician Assistant

## 2022-03-09 DIAGNOSIS — F5104 Psychophysiologic insomnia: Secondary | ICD-10-CM

## 2022-03-09 MED ORDER — ZOLPIDEM TARTRATE 10 MG PO TABS: 10.0000 mg | ORAL_TABLET | Freq: Every evening | ORAL | 0 refills | Status: DC | PRN

## 2022-03-11 ENCOUNTER — Other Ambulatory Visit: Payer: Self-pay | Admitting: Nurse Practitioner

## 2022-03-11 DIAGNOSIS — F5104 Psychophysiologic insomnia: Secondary | ICD-10-CM

## 2022-03-11 MED ORDER — ZOLPIDEM TARTRATE 10 MG PO TABS: 10.0000 mg | ORAL_TABLET | Freq: Every evening | ORAL | 0 refills | Status: DC | PRN

## 2022-03-16 ENCOUNTER — Other Ambulatory Visit: Payer: Self-pay | Admitting: Physician Assistant

## 2022-03-16 DIAGNOSIS — F909 Attention-deficit hyperactivity disorder, unspecified type: Secondary | ICD-10-CM

## 2022-03-18 MED ORDER — AMPHETAMINE-DEXTROAMPHETAMINE 30 MG PO TABS
30.0000 mg | ORAL_TABLET | Freq: Two times a day (BID) | ORAL | 0 refills | Status: DC
Start: 2022-03-18 — End: 2022-04-15

## 2022-03-23 ENCOUNTER — Encounter: Payer: Self-pay | Admitting: Physician Assistant

## 2022-04-05 ENCOUNTER — Other Ambulatory Visit: Payer: Self-pay | Admitting: Physician Assistant

## 2022-04-05 DIAGNOSIS — M2550 Pain in unspecified joint: Secondary | ICD-10-CM

## 2022-04-08 IMAGING — MG MM DIGITAL SCREENING BILAT W/ TOMO AND CAD
6 of 10 series · 6 of 30 positions shown · non-contrast
Comparison: None.

CLINICAL DATA: Screening.

EXAM:
DIGITAL SCREENING BILATERAL MAMMOGRAM WITH TOMOSYNTHESIS AND CAD
TECHNIQUE: Bilateral screening digital craniocaudal and mediolateral oblique
mammograms were obtained. Bilateral screening digital breast
tomosynthesis was performed. The images were evaluated with
computer-aided detection.

[L MLO synth-2D]
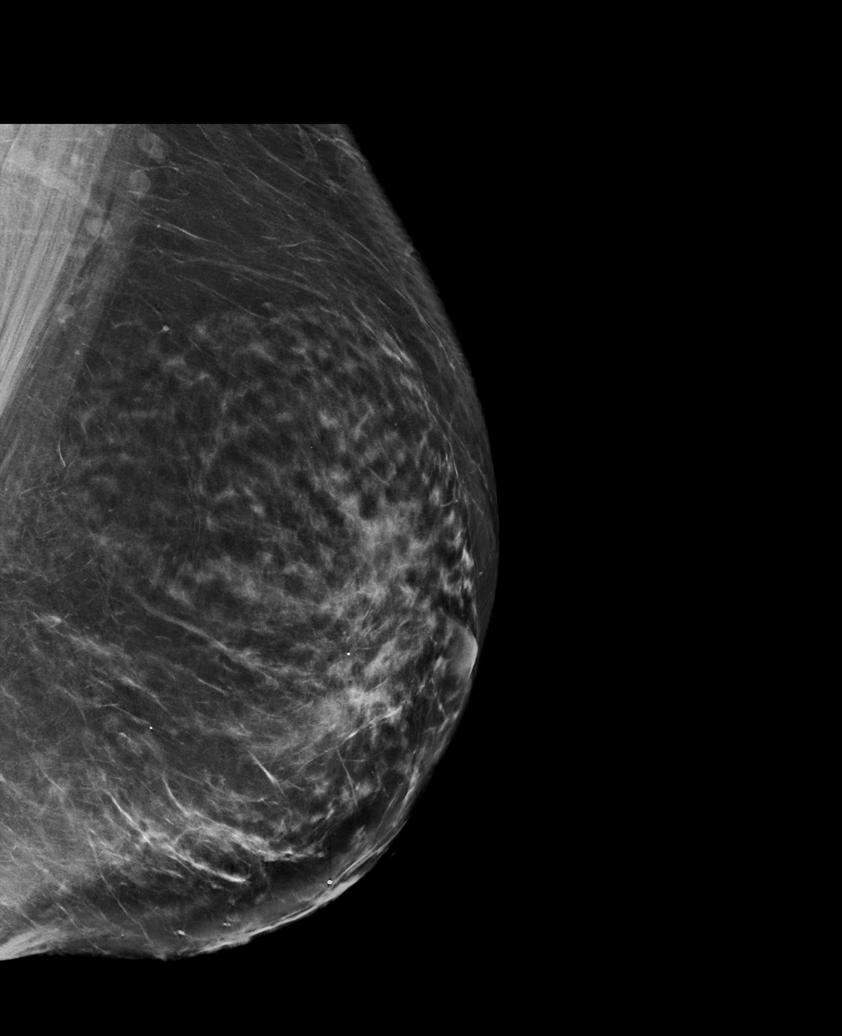

[L CC synth-2D]
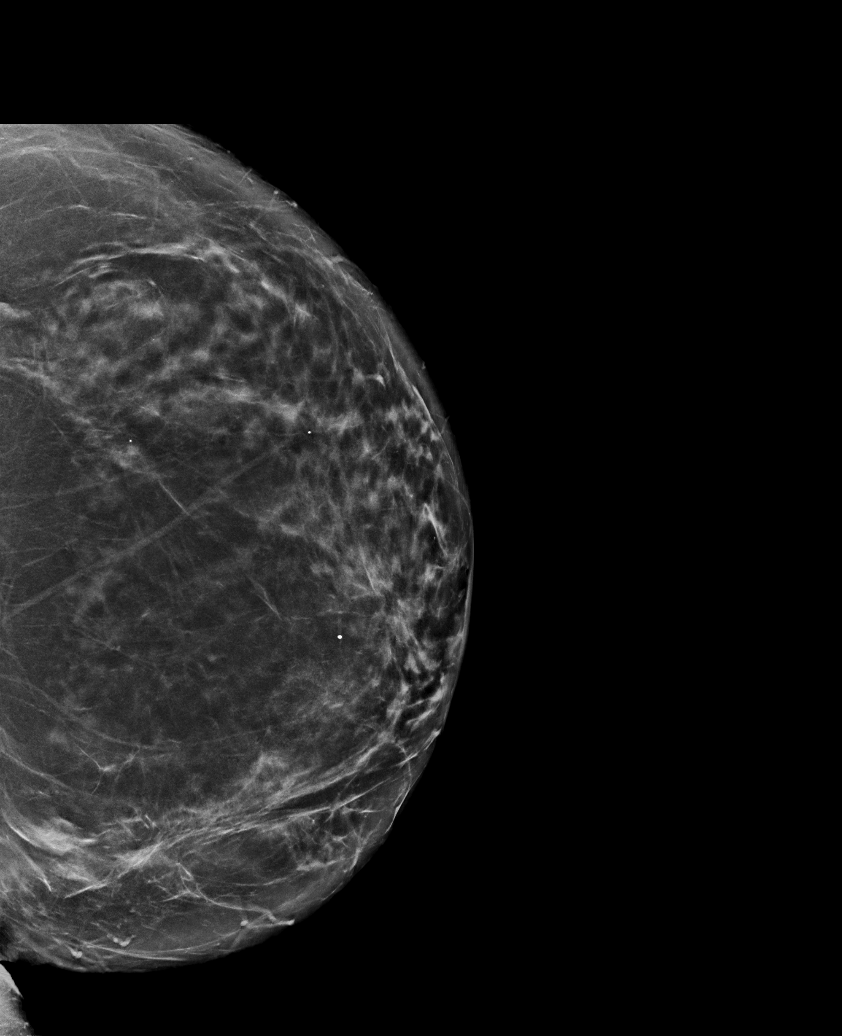

[R MLO synth-2D]
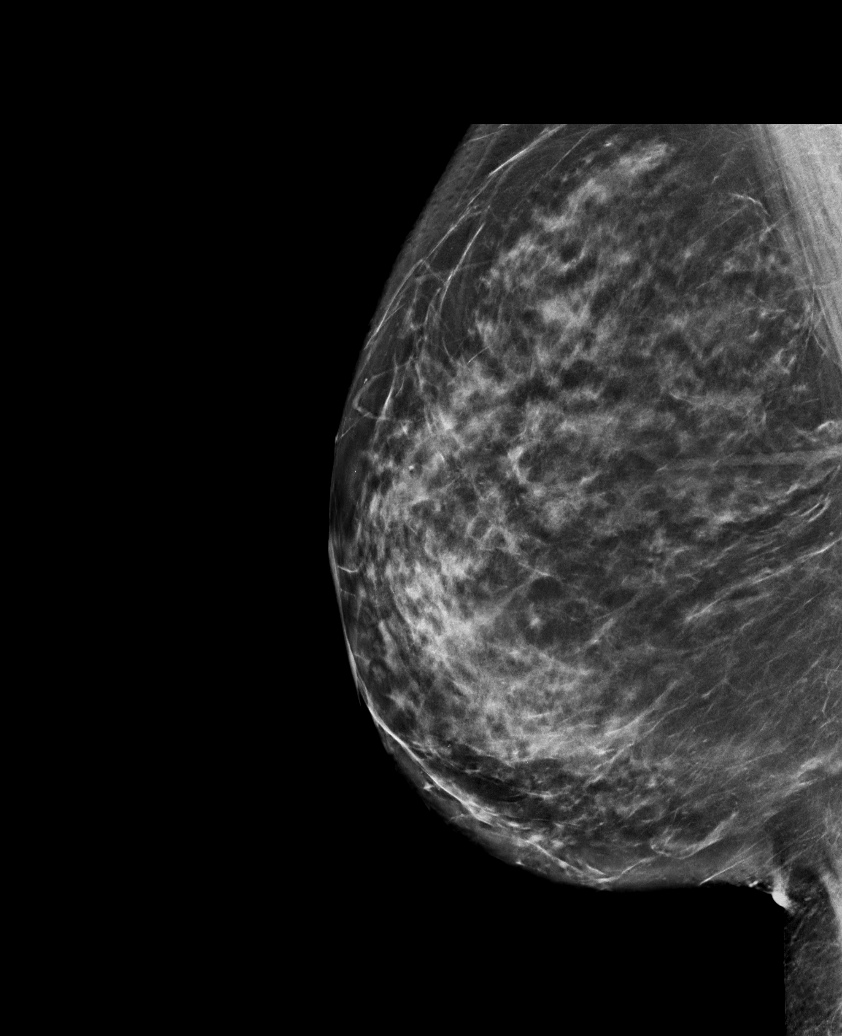

[R CC synth-2D]
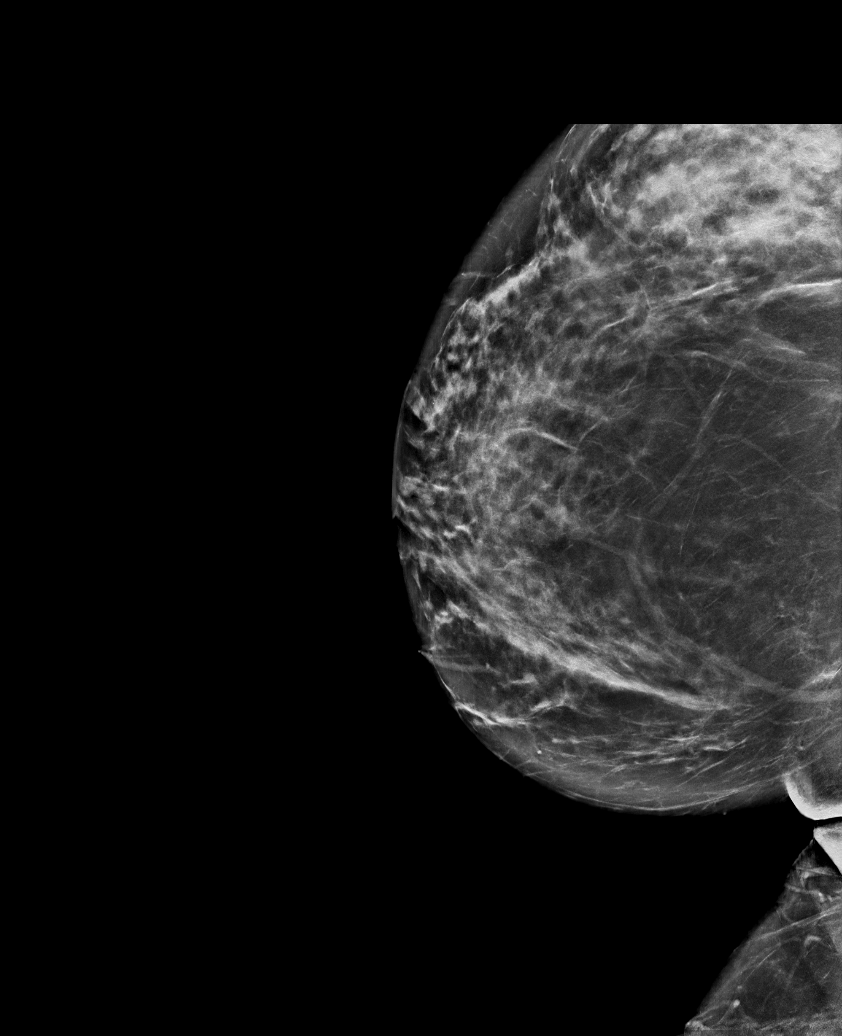

[R XCCL synth-2D]
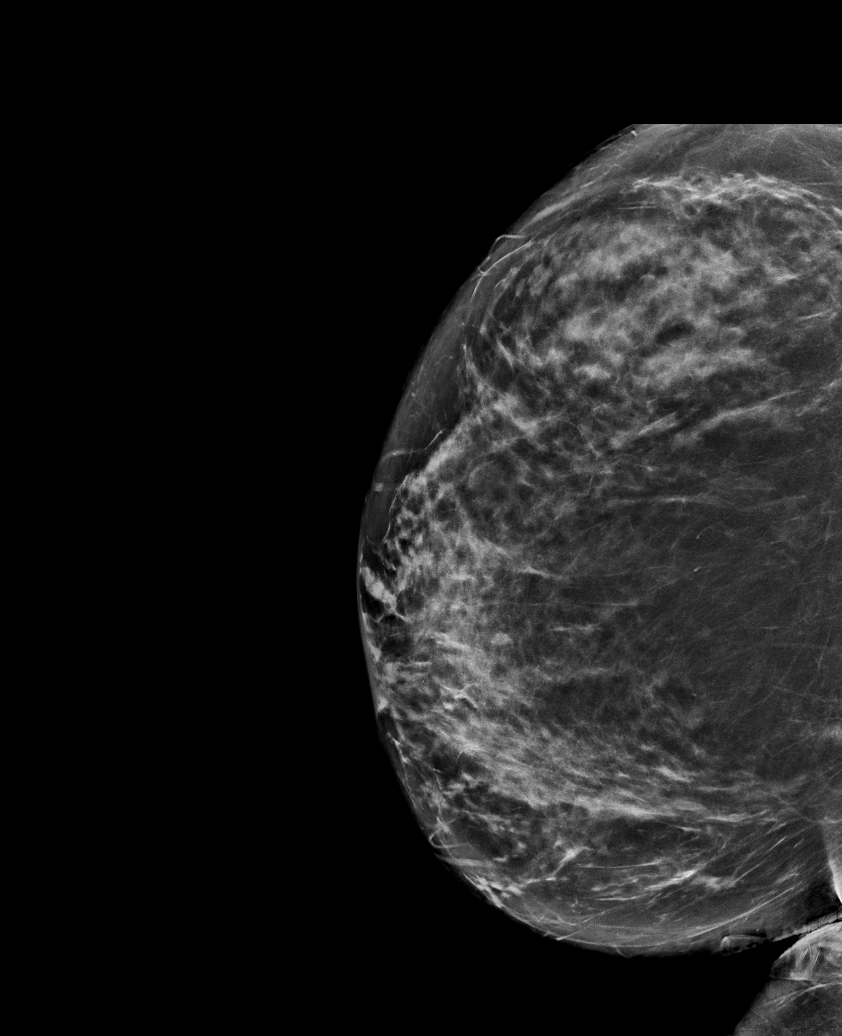

[L MLO tomo · tomo slice 47/94.0]
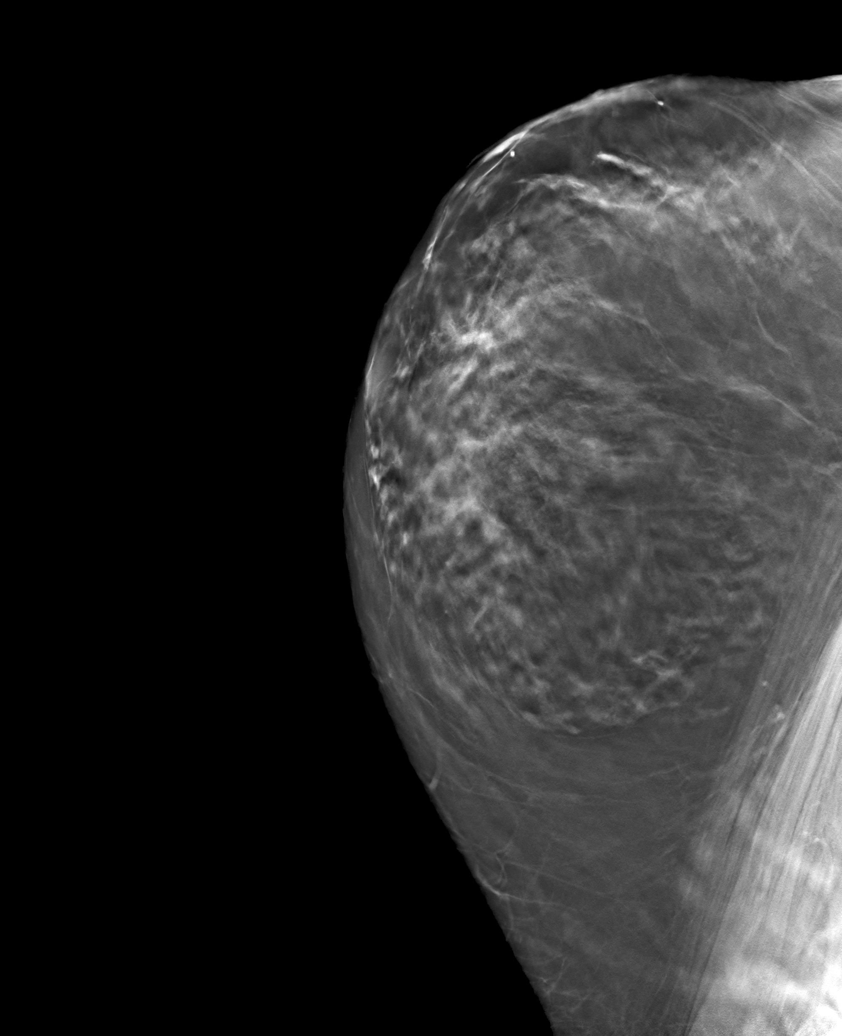

[6 of 30 positions shown; findings below may reference images not displayed]

ACR Breast Density Category c: The breast tissue is heterogeneously
dense, which may obscure small masses
FINDINGS: There are no findings suspicious for malignancy.
IMPRESSION: No mammographic evidence of malignancy. A result letter of this
screening mammogram will be mailed directly to the patient.

RECOMMENDATION:
Screening mammogram in one year. (Code:C8-T-HNK)

BI-RADS CATEGORY  1: Negative.

## 2022-04-13 ENCOUNTER — Telehealth: Payer: Self-pay | Admitting: Physician Assistant

## 2022-04-13 DIAGNOSIS — F5104 Psychophysiologic insomnia: Secondary | ICD-10-CM

## 2022-04-13 NOTE — Telephone Encounter (Signed)
Patient requesting refill of Ambien-sent to [facility] in [place]. Please advise.

## 2022-04-14 NOTE — Telephone Encounter (Signed)
lmtc

## 2022-04-15 ENCOUNTER — Encounter: Payer: Self-pay | Admitting: Physician Assistant

## 2022-04-15 DIAGNOSIS — F909 Attention-deficit hyperactivity disorder, unspecified type: Secondary | ICD-10-CM

## 2022-04-15 DIAGNOSIS — F5104 Psychophysiologic insomnia: Secondary | ICD-10-CM

## 2022-04-15 MED ORDER — AMPHETAMINE-DEXTROAMPHETAMINE 30 MG PO TABS
30.0000 mg | ORAL_TABLET | Freq: Two times a day (BID) | ORAL | 0 refills | Status: DC
Start: 2022-04-15 — End: 2022-05-19

## 2022-04-15 MED ORDER — ZOLPIDEM TARTRATE 10 MG PO TABS: 10.0000 mg | ORAL_TABLET | Freq: Every evening | ORAL | 0 refills | Status: DC | PRN

## 2022-05-09 ENCOUNTER — Other Ambulatory Visit: Payer: Self-pay | Admitting: Physician Assistant

## 2022-05-09 DIAGNOSIS — M2550 Pain in unspecified joint: Secondary | ICD-10-CM

## 2022-05-12 ENCOUNTER — Other Ambulatory Visit: Payer: Self-pay | Admitting: Physician Assistant

## 2022-05-12 DIAGNOSIS — F5104 Psychophysiologic insomnia: Secondary | ICD-10-CM

## 2022-05-13 ENCOUNTER — Other Ambulatory Visit: Payer: Self-pay | Admitting: Nurse Practitioner

## 2022-05-13 DIAGNOSIS — F5104 Psychophysiologic insomnia: Secondary | ICD-10-CM

## 2022-05-13 MED ORDER — ZOLPIDEM TARTRATE 10 MG PO TABS: 10.0000 mg | ORAL_TABLET | Freq: Every evening | ORAL | 0 refills | Status: DC | PRN

## 2022-05-13 NOTE — Telephone Encounter (Signed)
Single refill sent for this. Looks like she has been advised to follow up end of September to reassess

## 2022-05-19 ENCOUNTER — Other Ambulatory Visit: Payer: Self-pay | Admitting: Physician Assistant

## 2022-05-19 DIAGNOSIS — F909 Attention-deficit hyperactivity disorder, unspecified type: Secondary | ICD-10-CM

## 2022-05-19 MED ORDER — AMPHETAMINE-DEXTROAMPHETAMINE 30 MG PO TABS
30.0000 mg | ORAL_TABLET | Freq: Two times a day (BID) | ORAL | 0 refills | Status: DC
Start: 2022-05-19 — End: 2022-06-22

## 2022-06-17 ENCOUNTER — Ambulatory Visit: Payer: Self-pay | Admitting: Physician Assistant

## 2022-06-22 ENCOUNTER — Encounter: Payer: Self-pay | Admitting: Physician Assistant

## 2022-06-22 ENCOUNTER — Ambulatory Visit (INDEPENDENT_AMBULATORY_CARE_PROVIDER_SITE_OTHER): Payer: Commercial Managed Care - HMO | Admitting: Physician Assistant

## 2022-06-22 VITALS — BP 122/75 | HR 68 | Ht 68.0 in | Wt 190.0 lb

## 2022-06-22 DIAGNOSIS — F909 Attention-deficit hyperactivity disorder, unspecified type: Secondary | ICD-10-CM | POA: Diagnosis not present

## 2022-06-22 DIAGNOSIS — F411 Generalized anxiety disorder: Secondary | ICD-10-CM | POA: Diagnosis not present

## 2022-06-22 DIAGNOSIS — F339 Major depressive disorder, recurrent, unspecified: Secondary | ICD-10-CM

## 2022-06-22 DIAGNOSIS — F5104 Psychophysiologic insomnia: Secondary | ICD-10-CM | POA: Diagnosis not present

## 2022-06-22 MED ORDER — AMPHETAMINE-DEXTROAMPHET ER 20 MG PO CP24: 20.0000 mg | ORAL_CAPSULE | ORAL | 0 refills | Status: DC

## 2022-06-22 MED ORDER — DULOXETINE HCL 60 MG PO CPEP: 60.0000 mg | ORAL_CAPSULE | Freq: Two times a day (BID) | ORAL | 0 refills | Status: DC

## 2022-06-22 MED ORDER — AMPHETAMINE-DEXTROAMPHETAMINE 30 MG PO TABS: ORAL_TABLET | ORAL | 0 refills | Status: DC

## 2022-06-22 MED ORDER — ZOLPIDEM TARTRATE 10 MG PO TABS: 10.0000 mg | ORAL_TABLET | Freq: Every evening | ORAL | 1 refills | Status: DC | PRN

## 2022-06-22 MED ORDER — DICLOFENAC SODIUM 75 MG PO TBEC: 75.0000 mg | DELAYED_RELEASE_TABLET | Freq: Two times a day (BID) | ORAL | 0 refills | Status: DC

## 2022-06-22 MED ORDER — METHOCARBAMOL 750 MG PO TABS: 750.0000 mg | ORAL_TABLET | Freq: Three times a day (TID) | ORAL | 0 refills | Status: DC

## 2022-06-22 NOTE — Assessment & Plan Note (Addendum)
-  Stable. Continue Ambien 10 mg as needed for sleep.

## 2022-06-22 NOTE — Assessment & Plan Note (Signed)
-  Stable. PHQ-9 score of 9, baseline. Continue duloxetine 60 mg BID.

## 2022-06-22 NOTE — Progress Notes (Signed)
Established patient visit   Patient: Abigail Smith   DOB: [birth year]   41 y.o. Female  MRN: [id number] Visit Date: 06/22/2022  Chief Complaint  Patient presents with   Medication Refill   Subjective    HPI  Patient presents for mood follow-up. Patient reports wants to try changing to Adderall extended release. Continues to struggle with consistent focus and concentration. Has been on Adderall 30 mg BID for quite some time. Mood has been stable. Taking duloxetine as directed without issues. Continues to take Ambien 10 mg on most nights for sleep. Patient was started on diclofenac and methocarbamol by orthopedics.      06/22/2022    3:32 PM 03/04/2022    2:46 PM 12/02/2021    3:58 PM 09/03/2021    3:04 PM 05/22/2021    1:12 PM  Depression screen PHQ 2/9  Decreased Interest 1 0 '1 1 1  '$ Down, Depressed, Hopeless '1 1 1 1 '$ 0  PHQ - 2 Score '2 1 2 2 1  '$ Altered sleeping '2 3 3 3 1  '$ Tired, decreased energy '1 3 1 3 1  '$ Change in appetite 0 0 '3 1 1  '$ Feeling bad or failure about yourself  1 0 0 1 0  Trouble concentrating '2 2 3 3 3  '$ Moving slowly or fidgety/restless 1 1 0 0 0  Suicidal thoughts 0 0 0 0 0  PHQ-9 Score '9 10 12 13 7  '$ Difficult doing work/chores  Extremely dIfficult Extremely dIfficult Very difficult       06/22/2022    3:32 PM 03/04/2022    2:48 PM 12/02/2021    3:59 PM 09/03/2021    3:05 PM  GAD 7 : Generalized Anxiety Score  Nervous, Anxious, on Edge '1 2 3 3  '$ Control/stop worrying '1 2 3 3  '$ Worry too much - different things 2 0 3 3  Trouble relaxing '2 2 3 3  '$ Restless '2 2 1 1  '$ Easily annoyed or irritable 1 0 0 0  Afraid - awful might happen '2 2 3 1  '$ Total GAD 7 Score '11 10 16 14  '$ Anxiety Difficulty  Extremely difficult Extremely difficult Very difficult        Medications: Outpatient Medications Prior to Visit  Medication Sig Note   triamcinolone ointment (KENALOG) 0.5 % APPLY TO AFFECTED AREA TWICE A DAY    [DISCONTINUED] amphetamine-dextroamphetamine (ADDERALL) 30  MG tablet Take 1 tablet by mouth 2 (two) times daily.    [DISCONTINUED] DULoxetine (CYMBALTA) 60 MG capsule Take 1 capsule (60 mg total) by mouth 2 (two) times daily.    [DISCONTINUED] meloxicam (MOBIC) 15 MG tablet TAKE 1 TABLET BY MOUTH EVERY DAY AS NEEDED FOR PAIN 06/22/2022: pt no longer taking   [DISCONTINUED] zolpidem (AMBIEN) 10 MG tablet Take 1 tablet (10 mg total) by mouth at bedtime as needed. for sleep    No facility-administered medications prior to visit.    Review of Systems Review of Systems:  A fourteen system review of systems was performed and found to be positive as per HPI.  Last CBC Lab Results  Component Value Date   WBC 7.5 01/01/2020   HGB 12.8 01/01/2020   HCT 37.4 01/01/2020   MCV 98 (H) 01/01/2020   MCH 33.6 (H) 01/01/2020   RDW 11.6 (L) 01/01/2020   PLT 478 (H) 80/99/8338   Last metabolic panel Lab Results  Component Value Date   GLUCOSE 117 (H) 01/01/2020   NA 135 01/01/2020   K 4.6  01/01/2020   CL 100 01/01/2020   CO2 20 01/01/2020   BUN 9 01/01/2020   CREATININE 0.65 01/01/2020   GFRNONAA 112 01/01/2020   CALCIUM 9.1 01/01/2020   PROT 6.8 01/01/2020   ALBUMIN 4.2 01/01/2020   LABGLOB 2.6 01/01/2020   AGRATIO 1.6 01/01/2020   BILITOT 0.3 01/01/2020   ALKPHOS 34 (L) 01/01/2020   AST 22 01/01/2020   ALT 29 01/01/2020   Last lipids Lab Results  Component Value Date   CHOL 216 (H) 01/01/2020   HDL 65 01/01/2020   LDLCALC 126 (H) 01/01/2020   TRIG 144 01/01/2020   CHOLHDL 3.3 01/01/2020   Last hemoglobin A1c Lab Results  Component Value Date   HGBA1C 5.5 01/01/2020   Last thyroid functions Lab Results  Component Value Date   TSH 1.180 01/01/2020   T3TOTAL 193 (H) 01/01/2020   Last vitamin D Lab Results  Component Value Date   VD25OH 52.9 01/01/2020       Objective    BP 122/75   Pulse 68   Ht '5\' 8"'$  (1.727 m)   Wt 190 lb (86.2 kg)   LMP 06/08/2022   SpO2 99%   BMI 28.89 kg/m  BP Readings from Last 3 Encounters:   06/22/22 122/75  12/02/21 125/79  09/03/21 130/76   Wt Readings from Last 3 Encounters:  06/22/22 190 lb (86.2 kg)  03/04/22 181 lb (82.1 kg)  12/02/21 192 lb (87.1 kg)    Physical Exam  General:  Well Developed, well nourished, appropriate for stated age.  Neuro:  Alert and oriented,  extra-ocular muscles intact  HEENT:  Normocephalic, atraumatic, neck supple  Skin:  no gross rash, warm, pink. Cardiac:  RRR, S1 S2 Respiratory: CTA B/L  Vascular:  Ext warm, no cyanosis apprec.; cap RF less 2 sec. Psych:  No HI/SI, judgement and insight good, Euthymic mood. Full Affect.   No results found for any visits on 06/22/22.  Assessment & Plan      Problem List Items Addressed This Visit       Other   ADHD (attention deficit hyperactivity disorder) - Primary    -PDMP reviewed, no aberrancies. Will trial Adderall XR 20 mg daily in the am and continue with Adderall 30 mg in the afternoon as needed. Advised to trial XR 20 mg for 4 weeks and if dose ineffective then we can consider increasing the dose. BP and pulse wnl's. Recommend updating controlled substance contract in the near future.       Relevant Medications   amphetamine-dextroamphetamine (ADDERALL XR) 20 MG 24 hr capsule   amphetamine-dextroamphetamine (ADDERALL) 30 MG tablet   Insomnia    -Stable. Continue Ambien 10 mg as needed for sleep.       Relevant Medications   zolpidem (AMBIEN) 10 MG tablet   GAD (generalized anxiety disorder)    -Stable, at baseline. Will continue duloxetine 60 mg BID.      Relevant Medications   DULoxetine (CYMBALTA) 60 MG capsule   Depression, recurrent (HCC)    -Stable. PHQ-9 score of 9, baseline. Continue duloxetine 60 mg BID.      Relevant Medications   DULoxetine (CYMBALTA) 60 MG capsule    Return in about 3 months (around 09/22/2022) for Mood, ADHD, insomnia (ok for telehealth).        Lorrene Reid, PA-C  [facility] at [facility] [phone]  (phone) [phone] (fax)  [place]

## 2022-06-22 NOTE — Assessment & Plan Note (Signed)
-  Stable, at baseline. Will continue duloxetine 60 mg BID.

## 2022-06-22 NOTE — Assessment & Plan Note (Addendum)
-  PDMP reviewed, no aberrancies. Will trial Adderall XR 20 mg daily in the am and continue with Adderall 30 mg in the afternoon as needed. Advised to trial XR 20 mg for 4 weeks and if dose ineffective then we can consider increasing the dose. BP and pulse wnl's. Recommend updating controlled substance contract in the near future.

## 2022-07-21 ENCOUNTER — Encounter: Payer: Self-pay | Admitting: Physician Assistant

## 2022-07-21 DIAGNOSIS — F5104 Psychophysiologic insomnia: Secondary | ICD-10-CM

## 2022-07-21 DIAGNOSIS — F909 Attention-deficit hyperactivity disorder, unspecified type: Secondary | ICD-10-CM

## 2022-07-22 MED ORDER — AMPHETAMINE-DEXTROAMPHET ER 30 MG PO CP24: 30.0000 mg | ORAL_CAPSULE | ORAL | 0 refills | Status: DC

## 2022-07-23 MED ORDER — ZOLPIDEM TARTRATE ER 12.5 MG PO TBCR: 12.5000 mg | EXTENDED_RELEASE_TABLET | Freq: Every evening | ORAL | 0 refills | Status: DC | PRN

## 2022-07-23 NOTE — Addendum Note (Signed)
Addended by: Lorrene Reid on: 07/23/2022 05:34 PM   Modules accepted: Orders

## 2022-08-07 DIAGNOSIS — Z419 Encounter for procedure for purposes other than remedying health state, unspecified: Secondary | ICD-10-CM | POA: Diagnosis not present

## 2022-08-20 ENCOUNTER — Encounter: Payer: Self-pay | Admitting: Nurse Practitioner

## 2022-08-21 ENCOUNTER — Other Ambulatory Visit: Payer: Self-pay | Admitting: Nurse Practitioner

## 2022-08-21 DIAGNOSIS — F411 Generalized anxiety disorder: Secondary | ICD-10-CM

## 2022-08-21 DIAGNOSIS — M2550 Pain in unspecified joint: Secondary | ICD-10-CM

## 2022-08-21 DIAGNOSIS — F909 Attention-deficit hyperactivity disorder, unspecified type: Secondary | ICD-10-CM

## 2022-08-21 DIAGNOSIS — F5104 Psychophysiologic insomnia: Secondary | ICD-10-CM

## 2022-08-21 MED ORDER — MELOXICAM 15 MG PO TABS: 15.0000 mg | ORAL_TABLET | Freq: Every day | ORAL | 0 refills | Status: DC

## 2022-08-21 MED ORDER — AMPHETAMINE-DEXTROAMPHETAMINE 30 MG PO TABS: ORAL_TABLET | ORAL | 0 refills | Status: DC

## 2022-08-21 MED ORDER — AMPHETAMINE-DEXTROAMPHET ER 30 MG PO CP24: 30.0000 mg | ORAL_CAPSULE | ORAL | 0 refills | Status: DC

## 2022-08-21 MED ORDER — DULOXETINE HCL 60 MG PO CPEP: 60.0000 mg | ORAL_CAPSULE | Freq: Two times a day (BID) | ORAL | 0 refills | Status: DC

## 2022-08-21 MED ORDER — ZOLPIDEM TARTRATE ER 12.5 MG PO TBCR: 12.5000 mg | EXTENDED_RELEASE_TABLET | Freq: Every evening | ORAL | 0 refills | Status: DC | PRN

## 2022-08-21 MED ORDER — METHOCARBAMOL 750 MG PO TABS: 750.0000 mg | ORAL_TABLET | Freq: Three times a day (TID) | ORAL | 0 refills | Status: DC

## 2022-08-25 ENCOUNTER — Telehealth: Payer: Self-pay

## 2022-08-25 ENCOUNTER — Other Ambulatory Visit: Payer: Self-pay | Admitting: Nurse Practitioner

## 2022-08-25 DIAGNOSIS — F909 Attention-deficit hyperactivity disorder, unspecified type: Secondary | ICD-10-CM

## 2022-08-25 MED ORDER — AMPHETAMINE-DEXTROAMPHETAMINE 30 MG PO TABS: ORAL_TABLET | ORAL | 0 refills | Status: DC

## 2022-08-25 NOTE — Telephone Encounter (Signed)
From her prior visits, she takes adderall capsule in AM and takes the adderall tablet in afternoons if needed.  I sent both to the pharmacy

## 2022-08-25 NOTE — Telephone Encounter (Signed)
Never mind about that first response. The tablet is supposed to be twice daily as needed and I changed this and sent new prescription to the pharmacy.

## 2022-08-25 NOTE — Telephone Encounter (Signed)
[facility] 30 mg. Pharmacy is asking if script is for 1 tablet in the afternoon as needed?

## 2022-09-07 DIAGNOSIS — Z419 Encounter for procedure for purposes other than remedying health state, unspecified: Secondary | ICD-10-CM | POA: Diagnosis not present

## 2022-09-21 NOTE — Progress Notes (Signed)
.Established patient visit   Patient: Abigail Smith   DOB: 06-02-1981   42 y.o. Female  MRN: [id number] Visit Date: 09/22/2022  Virtual Visit via Telephone Note  I connected with Chalese Arnoldo Hooker on 10/29/22 at  1:10 PM EST by telephone and verified that I am speaking with the correct person using two identifiers.  Location: Patient: home  Provider: [facility]     I discussed the limitations, risks, security and privacy concerns of performing an evaluation and management service by telephone and the availability of in person appointments. I also discussed with the patient that there may be a patient responsible charge related to this service. The patient expressed understanding and agreed to proceed.  Chief Complaint  Patient presents with   ADHD        Subjective    HPI HPI     ADHD    Additional comments:        Last edited by Gemma Payor, CMA on 09/22/2022  1:09 PM.      Follow up for medication -GAD/depression --takes duloxetine 60 mg  --Ambien CR 12.5 mg at night when needed  -ADD --takes Adderall XR 30 mg daily  --takes adderall 30 mg - one to two tablets daily  Does have inflammatory polyarthritis.  -currently taking meloxicam 15 mg daily. Does not feel like this helps much -does take robaxin. States that it is prescribed up to thee times daily. She does not take this often. When she does take it, it is helpful.  -has never been tested for RA or auto immune arthritis.    Medications: Outpatient Medications Prior to Visit  Medication Sig   triamcinolone ointment (KENALOG) 0.5 % APPLY TO AFFECTED AREA TWICE A DAY   [DISCONTINUED] amphetamine-dextroamphetamine (ADDERALL XR) 30 MG 24 hr capsule Take 1 capsule (30 mg total) by mouth every morning.   [DISCONTINUED] amphetamine-dextroamphetamine (ADDERALL) 30 MG tablet Take 1 to 2 tablets po in the afternoon as needed.   [DISCONTINUED] DULoxetine (CYMBALTA) 60 MG capsule Take 1 capsule  (60 mg total) by mouth 2 (two) times daily.   [DISCONTINUED] meloxicam (MOBIC) 15 MG tablet Take 1 tablet (15 mg total) by mouth daily.   [DISCONTINUED] methocarbamol (ROBAXIN) 750 MG tablet Take 1 tablet (750 mg total) by mouth 3 (three) times daily.   [DISCONTINUED] zolpidem (AMBIEN CR) 12.5 MG CR tablet Take 1 tablet (12.5 mg total) by mouth at bedtime as needed for sleep.   No facility-administered medications prior to visit.    Review of Systems  Constitutional:  Positive for fatigue. Negative for activity change, appetite change, chills and fever.  HENT:  Negative for congestion, postnasal drip, rhinorrhea, sinus pressure, sinus pain, sneezing and sore throat.   Eyes: Negative.   Respiratory:  Negative for cough, chest tightness, shortness of breath and wheezing.   Cardiovascular:  Negative for chest pain and palpitations.  Gastrointestinal:  Negative for abdominal pain, constipation, diarrhea, nausea and vomiting.  Endocrine: Negative for cold intolerance, heat intolerance, polydipsia and polyuria.  Genitourinary:  Negative for dyspareunia, dysuria, flank pain, frequency and urgency.  Musculoskeletal:  Negative for arthralgias, back pain and myalgias.  Skin:  Negative for rash.  Allergic/Immunologic: Negative for environmental allergies.  Neurological:  Negative for dizziness, weakness and headaches.  Hematological:  Negative for adenopathy.  Psychiatric/Behavioral:  Positive for decreased concentration, dysphoric mood and sleep disturbance. The patient is nervous/anxious.     Last CBC Lab Results  Component Value Date  WBC 7.5 01/01/2020   HGB 12.8 01/01/2020   HCT 37.4 01/01/2020   MCV 98 (H) 01/01/2020   MCH 33.6 (H) 01/01/2020   RDW 11.6 (L) 01/01/2020   PLT 478 (H) 0000000   Last metabolic panel Lab Results  Component Value Date   GLUCOSE 117 (H) 01/01/2020   NA 135 01/01/2020   K 4.6 01/01/2020   CL 100 01/01/2020   CO2 20 01/01/2020   BUN 9 01/01/2020    CREATININE 0.65 01/01/2020   GFRNONAA 112 01/01/2020   CALCIUM 9.1 01/01/2020   PROT 6.8 01/01/2020   ALBUMIN 4.2 01/01/2020   LABGLOB 2.6 01/01/2020   AGRATIO 1.6 01/01/2020   BILITOT 0.3 01/01/2020   ALKPHOS 34 (L) 01/01/2020   AST 22 01/01/2020   ALT 29 01/01/2020   Last lipids Lab Results  Component Value Date   CHOL 216 (H) 01/01/2020   HDL 65 01/01/2020   LDLCALC 126 (H) 01/01/2020   TRIG 144 01/01/2020   CHOLHDL 3.3 01/01/2020   Last hemoglobin A1c Lab Results  Component Value Date   HGBA1C 5.5 01/01/2020   Last thyroid functions Lab Results  Component Value Date   TSH 1.180 01/01/2020   T3TOTAL 193 (H) 01/01/2020   Last vitamin D Lab Results  Component Value Date   VD25OH 52.9 01/01/2020       Objective     Today's Vitals   09/22/22 1002  BP: 110/76  Pulse: 81  Weight: 185 lb (83.9 kg)   Body mass index is 28.13 kg/m.  BP Readings from Last 3 Encounters:  09/22/22 110/76  06/22/22 122/75  12/02/21 125/79    Wt Readings from Last 3 Encounters:  09/22/22 185 lb (83.9 kg)  06/22/22 190 lb (86.2 kg)  03/04/22 181 lb (82.1 kg)      Assessment & Plan    1. Multiple joint pain Add celebrex 200 mg twice daily as needed for pain/inflammation.  - celecoxib (CELEBREX) 200 MG capsule; Take 1 capsule (200 mg total) by mouth 2 (two) times daily.  Dispense: 60 capsule; Refill: 1  2. Attention deficit hyperactivity disorder (ADHD), unspecified ADHD type Take adderall XR 30 mg daily. May take adderall 30 mg up to twice daily as needed for focus and concentration. Three 30 day prescriptions were sent to her pharmacy. Dates are 09/22/2022, 10/21/2022, and 11/17/2022.  - amphetamine-dextroamphetamine (ADDERALL XR) 30 MG 24 hr capsule; Take 1 capsule (30 mg total) by mouth every morning.  Dispense: 30 capsule; Refill: 0 - amphetamine-dextroamphetamine (ADDERALL) 30 MG tablet; Take 1 to 2 tablets po in the afternoon as needed.  Dispense: 60 tablet; Refill:  0  3. GAD (generalized anxiety disorder) Continue to take cymbalta 60 mg twice daily  - DULoxetine (CYMBALTA) 60 MG capsule; Take 1 capsule (60 mg total) by mouth 2 (two) times daily.  Dispense: 180 capsule; Refill: 1  4. Psychophysiological insomnia May take smbien CR 12.5 mg at bedtime as needed for insomnia. New prescription sent to pharmacy.  - zolpidem (AMBIEN CR) 12.5 MG CR tablet; Take 1 tablet (12.5 mg total) by mouth at bedtime as needed for sleep.  Dispense: 30 tablet; Refill: 3   Problem List Items Addressed This Visit       Other   ADHD (attention deficit hyperactivity disorder)   Relevant Medications   amphetamine-dextroamphetamine (ADDERALL XR) 30 MG 24 hr capsule   amphetamine-dextroamphetamine (ADDERALL) 30 MG tablet   Insomnia   Relevant Medications   zolpidem (AMBIEN CR) 12.5 MG CR  tablet   GAD (generalized anxiety disorder)   Relevant Medications   DULoxetine (CYMBALTA) 60 MG capsule   Multiple joint pain - Primary   Relevant Medications   celecoxib (CELEBREX) 200 MG capsule     Return in about 3 months (around 12/22/2022) for health maintenance exam, FBW a week prior to visit. add connective tissue lab.        I discussed the assessment and treatment plan with the patient. The patient was provided an opportunity to ask questions and all were answered. The patient agreed with the plan and demonstrated an understanding of the instructions.   The patient was advised to call back or seek an in-person evaluation if the symptoms worsen or if the condition fails to improve as anticipated.  I provided 15 minutes of non-face-to-face time during this encounter.   Ronnell Freshwater, NP   Ronnell Freshwater, NP  [facility] at [facility] [phone] (phone) [phone] (fax)  [place]

## 2022-09-22 ENCOUNTER — Encounter: Payer: Self-pay | Admitting: Nurse Practitioner

## 2022-09-22 ENCOUNTER — Ambulatory Visit (INDEPENDENT_AMBULATORY_CARE_PROVIDER_SITE_OTHER): Payer: Medicaid Other | Admitting: Nurse Practitioner

## 2022-09-22 ENCOUNTER — Other Ambulatory Visit: Payer: Self-pay | Admitting: Nurse Practitioner

## 2022-09-22 ENCOUNTER — Telehealth: Payer: Self-pay

## 2022-09-22 VITALS — BP 110/76 | HR 81 | Wt 185.0 lb

## 2022-09-22 DIAGNOSIS — F411 Generalized anxiety disorder: Secondary | ICD-10-CM

## 2022-09-22 DIAGNOSIS — F5104 Psychophysiologic insomnia: Secondary | ICD-10-CM

## 2022-09-22 DIAGNOSIS — F909 Attention-deficit hyperactivity disorder, unspecified type: Secondary | ICD-10-CM | POA: Diagnosis not present

## 2022-09-22 DIAGNOSIS — M2550 Pain in unspecified joint: Secondary | ICD-10-CM | POA: Diagnosis not present

## 2022-09-22 MED ORDER — DULOXETINE HCL 60 MG PO CPEP: 60.0000 mg | ORAL_CAPSULE | Freq: Two times a day (BID) | ORAL | 1 refills | Status: DC

## 2022-09-22 MED ORDER — ZOLPIDEM TARTRATE ER 12.5 MG PO TBCR: 12.5000 mg | EXTENDED_RELEASE_TABLET | Freq: Every evening | ORAL | 3 refills | Status: DC | PRN

## 2022-09-22 MED ORDER — AMPHETAMINE-DEXTROAMPHET ER 30 MG PO CP24: 30.0000 mg | ORAL_CAPSULE | ORAL | 0 refills | Status: DC

## 2022-09-22 MED ORDER — CELECOXIB 200 MG PO CAPS: 200.0000 mg | ORAL_CAPSULE | Freq: Two times a day (BID) | ORAL | 1 refills | Status: DC

## 2022-09-22 MED ORDER — AMPHETAMINE-DEXTROAMPHETAMINE 30 MG PO TABS: ORAL_TABLET | ORAL | 0 refills | Status: DC

## 2022-09-22 NOTE — Telephone Encounter (Signed)
Called pt for F/U appt. For a telehealth no answer VM was full

## 2022-10-08 DIAGNOSIS — Z419 Encounter for procedure for purposes other than remedying health state, unspecified: Secondary | ICD-10-CM | POA: Diagnosis not present

## 2022-10-29 DIAGNOSIS — M2550 Pain in unspecified joint: Secondary | ICD-10-CM | POA: Insufficient documentation

## 2022-11-06 DIAGNOSIS — Z419 Encounter for procedure for purposes other than remedying health state, unspecified: Secondary | ICD-10-CM | POA: Diagnosis not present

## 2022-11-19 ENCOUNTER — Other Ambulatory Visit: Payer: Self-pay | Admitting: Nurse Practitioner

## 2022-11-19 DIAGNOSIS — M2550 Pain in unspecified joint: Secondary | ICD-10-CM

## 2022-12-07 DIAGNOSIS — Z419 Encounter for procedure for purposes other than remedying health state, unspecified: Secondary | ICD-10-CM | POA: Diagnosis not present

## 2022-12-14 ENCOUNTER — Other Ambulatory Visit: Payer: Self-pay

## 2022-12-14 DIAGNOSIS — Z0000 Encounter for general adult medical examination without abnormal findings: Secondary | ICD-10-CM

## 2022-12-14 DIAGNOSIS — Z130 Encounter for screening for diseases of the blood and blood-forming organs and certain disorders involving the immune mechanism: Secondary | ICD-10-CM

## 2022-12-15 ENCOUNTER — Other Ambulatory Visit: Payer: Commercial Managed Care - HMO

## 2022-12-22 ENCOUNTER — Encounter: Payer: Commercial Managed Care - HMO | Admitting: Family Medicine

## 2022-12-22 NOTE — Progress Notes (Deleted)
Complete physical exam  Patient: Abigail Smith   DOB: Dec 23, 1980   42 y.o. Female  MRN: [id number]  Subjective:    No chief complaint on file.   Abigail Smith is a 42 y.o. female who presents today for a complete physical exam. She reports consuming a {diet types:17450} diet. {types:19826} She generally feels {DESC; WELL/FAIRLY WELL/POORLY:18703}. She reports sleeping {DESC; WELL/FAIRLY WELL/POORLY:18703}. She {does/does not:200015} have additional problems to discuss today.    Most recent fall risk assessment:    03/04/2022    2:46 PM  Fall Risk   Falls in the past year? 0  Number falls in past yr: 0  Injury with Fall? 0  Risk for fall due to : No Fall Risks  Follow up Falls evaluation completed     Most recent depression screenings:    06/22/2022    3:32 PM 03/04/2022    2:46 PM  PHQ 2/9 Scores  PHQ - 2 Score 2 1  PHQ- 9 Score 9 10    Patient Active Problem List   Diagnosis Date Noted   Multiple joint pain 10/29/2022   Depression, recurrent 06/22/2022   GAD (generalized anxiety disorder) 10/06/2018   Supervision of high risk pregnancy, antepartum 02/17/2018   AMA (advanced maternal age) primigravida 35+, first trimester 02/17/2018   Missed abortion with fetal demise before 20 completed weeks of gestation 02/17/2018   Healthcare maintenance 01/19/2018   ADHD (attention deficit hyperactivity disorder) 01/19/2018   Insomnia 01/19/2018    Past Surgical History:  Procedure Laterality Date   BREAST REDUCTION SURGERY  2018   DILATION AND CURETTAGE OF UTERUS     EAB   Social History   Tobacco Use   Smoking status: Never    Passive exposure: Never   Smokeless tobacco: Never  Vaping Use   Vaping Use: Never used  Substance Use Topics   Alcohol use: Not Currently    Alcohol/week: 3.0 - 4.0 standard drinks of alcohol    Types: 3 - 4 Glasses of wine per week   Drug use: No   Family History  Problem Relation Age of Onset   Hypertension Mother    Heart disease  Brother    Cancer Maternal Grandmother        lung   Macular degeneration Maternal Grandmother    Cancer Paternal Grandmother        stomach   Alcohol abuse Paternal Grandfather    Cancer Paternal Grandfather        esophageal   Allergies  Allergen Reactions   Vicodin [Hydrocodone-Acetaminophen] Hives   Vancomycin Other (See Comments)    Red man syndrome     Patient Care Team: Mayer Masker, PA-C (Inactive) as PCP - General (Physician Assistant) Tereso Newcomer, MD as Attending Physician (Obstetrics and Gynecology)   Outpatient Medications Prior to Visit  Medication Sig   amphetamine-dextroamphetamine (ADDERALL XR) 30 MG 24 hr capsule Take 1 capsule (30 mg total) by mouth every morning.   amphetamine-dextroamphetamine (ADDERALL) 30 MG tablet Take 1 to 2 tablets po in the afternoon as needed.   celecoxib (CELEBREX) 200 MG capsule TAKE 1 CAPSULE BY MOUTH TWICE A DAY *DISCONTINUE MELOXICAM*   DULoxetine (CYMBALTA) 60 MG capsule Take 1 capsule (60 mg total) by mouth 2 (two) times daily.   methocarbamol (ROBAXIN) 750 MG tablet TAKE 1 TABLET BY MOUTH 3 TIMES DAILY.   triamcinolone ointment (KENALOG) 0.5 % APPLY TO AFFECTED AREA TWICE A DAY   zolpidem (AMBIEN CR) 12.5 MG  CR tablet Take 1 tablet (12.5 mg total) by mouth at bedtime as needed for sleep.   No facility-administered medications prior to visit.    ROS     Objective:    There were no vitals taken for this visit.   Physical Exam   No results found for any visits on 12/22/22.     Assessment & Plan:    Routine Health Maintenance and Physical Exam  Immunization History  Administered Date(s) Administered   Influenza-Unspecified 06/20/2018   PFIZER(Purple Top)SARS-COV-2 Vaccination 01/12/2020, 02/02/2020   Tdap 12/30/2009, 12/02/2021    Health Maintenance  Topic Date Due   Hepatitis C Screening  Never done   PAP SMEAR-Modifier  Never done   COVID-19 Vaccine (3 - 2023-24 season) 05/08/2022   INFLUENZA  VACCINE  04/08/2023   DTaP/Tdap/Td (3 - Td or Tdap) 12/03/2031   HIV Screening  Completed   HPV VACCINES  Aged Out    Discussed health benefits of physical activity, and encouraged her to engage in regular exercise appropriate for her age and condition.  There are no diagnoses linked to this encounter.  No follow-ups on file.     Melida Quitter, PA

## 2022-12-24 ENCOUNTER — Encounter: Payer: Self-pay | Admitting: Family Medicine

## 2022-12-24 ENCOUNTER — Ambulatory Visit (INDEPENDENT_AMBULATORY_CARE_PROVIDER_SITE_OTHER): Payer: Medicaid Other | Admitting: Family Medicine

## 2022-12-24 ENCOUNTER — Other Ambulatory Visit: Payer: Self-pay | Admitting: Family Medicine

## 2022-12-24 VITALS — BP 133/88 | HR 89 | Resp 18 | Ht 68.0 in | Wt 201.0 lb

## 2022-12-24 DIAGNOSIS — F339 Major depressive disorder, recurrent, unspecified: Secondary | ICD-10-CM | POA: Diagnosis not present

## 2022-12-24 DIAGNOSIS — F5104 Psychophysiologic insomnia: Secondary | ICD-10-CM | POA: Diagnosis not present

## 2022-12-24 DIAGNOSIS — M2550 Pain in unspecified joint: Secondary | ICD-10-CM

## 2022-12-24 DIAGNOSIS — Z0000 Encounter for general adult medical examination without abnormal findings: Secondary | ICD-10-CM | POA: Diagnosis not present

## 2022-12-24 DIAGNOSIS — Z130 Encounter for screening for diseases of the blood and blood-forming organs and certain disorders involving the immune mechanism: Secondary | ICD-10-CM

## 2022-12-24 DIAGNOSIS — F411 Generalized anxiety disorder: Secondary | ICD-10-CM | POA: Diagnosis not present

## 2022-12-24 DIAGNOSIS — Z1329 Encounter for screening for other suspected endocrine disorder: Secondary | ICD-10-CM | POA: Diagnosis not present

## 2022-12-24 DIAGNOSIS — Z13228 Encounter for screening for other metabolic disorders: Secondary | ICD-10-CM

## 2022-12-24 MED ORDER — CELECOXIB 200 MG PO CAPS
ORAL_CAPSULE | ORAL | 1 refills | Status: DC
Start: 2022-12-24 — End: 2023-04-12

## 2022-12-24 MED ORDER — MIRTAZAPINE 7.5 MG PO TABS
ORAL_TABLET | ORAL | 1 refills | Status: DC
Start: 2022-12-24 — End: 2022-12-25

## 2022-12-24 NOTE — Patient Instructions (Addendum)
STOP taking Ambien START taking mirtazapine (Remeron) nightly about an hour before bed to help with sleep and anxiety symptoms.  Return for fasting lab work within the next week or so. I will see you in about 6 weeks for follow-up appointment to see how everything is going!

## 2022-12-24 NOTE — Assessment & Plan Note (Signed)
PHQ-9 score of 20, GAD-7 score of 21.  Increased depression and anxiety from baseline.  Continue Cymbalta 60 mg daily.  Previous trial of BuSpar resulted in intolerable sedation during the day.  Discontinue Ambien, add mirtazapine 7.5 to 15 mg nightly before bed.  I am hopeful that this will improve mood in addition to helping her sleep.  Follow-up in about 6 weeks to adjust as needed.

## 2022-12-24 NOTE — Assessment & Plan Note (Signed)
Discontinue Ambien.  Start mirtazapine 7.5 to 15 mg nightly before bed.

## 2022-12-24 NOTE — Progress Notes (Signed)
Complete physical exam  Patient: Abigail Smith   DOB: 1981/09/01   42 y.o. Female  MRN: [id number]  Subjective:    Chief Complaint  Patient presents with   Annual Exam    Abigail Smith is a 42 y.o. female who presents today for a complete physical exam. She reports consuming a general diet. Home exercise routine includes yoga and walking/running. She generally feels fairly well. She reports sleeping well as long as she takes Ambien. She does have additional problems to discuss today.  She has been feeling more anxious than usual recently.  She is still taking Cymbalta 60 mg daily but feels that it is not helping her as much as it used to.  She does have agoraphobia and finds that that has been flaring more often than usual as well.  This is consequently affecting her sleep.  She also continues to have polyarthritis.  The Celebrex offers moderate relief, but she also has stiffness, neuropathic pain, and the occasional tremor which is concerning to her.  She has never been worked up for these issues before.   Most recent fall risk assessment:    03/04/2022    2:46 PM  Fall Risk   Falls in the past year? 0  Number falls in past yr: 0  Injury with Fall? 0  Risk for fall due to : No Fall Risks  Follow up Falls evaluation completed     Most recent depression and anxiety screenings:    06/22/2022    3:32 PM 03/04/2022    2:46 PM  PHQ 2/9 Scores  PHQ - 2 Score 2 1  PHQ- 9 Score 9 10      06/22/2022    3:32 PM 03/04/2022    2:48 PM 12/02/2021    3:59 PM 09/03/2021    3:05 PM  GAD 7 : Generalized Anxiety Score  Nervous, Anxious, on Edge Control/stop worrying Worry too much - different things 2 0 3 3  Trouble relaxing Restless Easily annoyed or irritable 1 0 0 0  Afraid - awful might happen Total GAD 7 Score Anxiety Difficulty  Extremely difficult Extremely difficult Very difficult    Patient Active Problem List    Diagnosis Date Noted   Multiple joint pain 10/29/2022   Depression, recurrent 06/22/2022   GAD (generalized anxiety disorder) 10/06/2018   Insomnia 01/19/2018   ADHD (attention deficit hyperactivity disorder) 09/07/1990    Past Surgical History:  Procedure Laterality Date   BREAST REDUCTION SURGERY  2018   DILATION AND CURETTAGE OF UTERUS     EAB   Social History   Tobacco Use   Smoking status: Never    Passive exposure: Never   Smokeless tobacco: Never  Vaping Use   Vaping Use: Never used  Substance Use Topics   Alcohol use: Not Currently    Alcohol/week: 3.0 - 4.0 standard drinks of alcohol    Types: 3 - 4 Glasses of wine per week   Drug use: No   Family History  Problem Relation Age of Onset   Hypertension Mother    Heart disease Brother    Cancer Maternal Grandmother        lung   Macular degeneration Maternal Grandmother    Cancer Paternal Grandmother        stomach   Alcohol  abuse Paternal Grandfather    Cancer Paternal Grandfather        esophageal   Allergies  Allergen Reactions   Vicodin [Hydrocodone-Acetaminophen] Hives   Vancomycin Other (See Comments)    Red man syndrome     Patient Care Team: Melida Quitter, PA as PCP - General (Family Medicine) Tereso Newcomer, MD as Attending Physician (Obstetrics and Gynecology)   Outpatient Medications Prior to Visit  Medication Sig   amphetamine-dextroamphetamine (ADDERALL XR) 30 MG 24 hr capsule Take 1 capsule (30 mg total) by mouth every morning.   amphetamine-dextroamphetamine (ADDERALL) 30 MG tablet Take 1 to 2 tablets po in the afternoon as needed.   DULoxetine (CYMBALTA) 60 MG capsule Take 1 capsule (60 mg total) by mouth 2 (two) times daily.   methocarbamol (ROBAXIN) 750 MG tablet TAKE 1 TABLET BY MOUTH 3 TIMES DAILY.   triamcinolone ointment (KENALOG) 0.5 % APPLY TO AFFECTED AREA TWICE A DAY   zolpidem (AMBIEN CR) 12.5 MG CR tablet Take 1 tablet (12.5 mg total) by mouth at bedtime as needed for  sleep.   [DISCONTINUED] celecoxib (CELEBREX) 200 MG capsule TAKE 1 CAPSULE BY MOUTH TWICE A DAY *DISCONTINUE MELOXICAM*   No facility-administered medications prior to visit.    Review of Systems  Constitutional:  Negative for chills, fever and malaise/fatigue.  HENT:  Negative for congestion and hearing loss.   Eyes:  Negative for blurred vision and double vision.  Respiratory:  Negative for cough and shortness of breath.   Cardiovascular:  Negative for chest pain, palpitations and leg swelling.  Gastrointestinal:  Negative for abdominal pain, constipation, diarrhea and heartburn.  Genitourinary:  Negative for frequency and urgency.  Musculoskeletal:  Positive for joint pain (Multiple joints, worse in the morning and with rest). Negative for myalgias and neck pain.  Neurological:  Negative for headaches.  Endo/Heme/Allergies:  Negative for polydipsia.  Psychiatric/Behavioral:  Positive for depression. The patient is nervous/anxious and has insomnia.        Objective:    BP 133/88 (BP Location: Right Arm, Patient Position: Sitting, Cuff Size: Normal)   Pulse 89   Resp 18   Ht  (1.727 m)   Wt 201 lb (91.2 kg)   SpO2 98%   BMI 30.56 kg/m    Physical Exam Constitutional:      General: She is not in acute distress.    Appearance: Normal appearance.  HENT:     Head: Normocephalic and atraumatic.     Right Ear: Tympanic membrane, ear canal and external ear normal.     Left Ear: Tympanic membrane, ear canal and external ear normal.     Nose: Nose normal.     Mouth/Throat:     Mouth: Mucous membranes are moist.     Pharynx: No oropharyngeal exudate or posterior oropharyngeal erythema.  Eyes:     Extraocular Movements: Extraocular movements intact.     Pupils: Pupils are equal, round, and reactive to light.     Comments: Wears glasses, up-to-date  Neck:     Thyroid: No thyroid mass, thyromegaly or thyroid tenderness.  Cardiovascular:     Rate and Rhythm: Normal rate  and regular rhythm.     Heart sounds: No murmur heard.    No friction rub. No gallop.  Pulmonary:     Effort: Pulmonary effort is normal.     Breath sounds: Normal breath sounds. No wheezing, rhonchi or rales.  Abdominal:     General: Abdomen is flat. Bowel sounds  are normal.  Musculoskeletal:        General: Normal range of motion.     Cervical back: Normal range of motion and neck supple.  Lymphadenopathy:     Cervical: No cervical adenopathy.  Skin:    General: Skin is warm and dry.  Neurological:     Mental Status: She is alert and oriented to person, place, and time.  Psychiatric:        Mood and Affect: Mood normal.       Assessment & Plan:    Routine Health Maintenance and Physical Exam  Immunization History  Administered Date(s) Administered   Influenza,inj,Quad PF,6+ Mos 06/04/2017   Influenza,trivalent, recombinat, inj, PF 07/01/2016   Influenza-Unspecified 06/20/2018   PFIZER(Purple Top)SARS-COV-2 Vaccination 01/12/2020, 02/02/2020   Tdap 12/30/2009, 12/02/2021    Health Maintenance  Topic Date Due   Hepatitis C Screening  Never done   PAP SMEAR-Modifier  Never done   COVID-19 Vaccine (3 - 2023-24 season) 05/08/2022   INFLUENZA VACCINE  04/08/2023   DTaP/Tdap/Td (3 - Td or Tdap) 12/03/2031   HIV Screening  Completed   HPV VACCINES  Aged Out   Patient due for Pap smear, she would like to have this done with primary care office.  Discussed health benefits of physical activity, and encouraged her to engage in regular exercise appropriate for her age and condition.  Wellness examination -     T4, free; Future  Screening for endocrine, metabolic, and immunity disorder -     T4, free; Future  Depression, recurrent -     Mirtazapine; Take 1-2 tablet (7.5-15 mg total) by mouth at bedtime.  Dispense: 90 tablet; Refill: 1  GAD (generalized anxiety disorder) Assessment & Plan: PHQ-9 score of 20, GAD-7 score of 21.  Increased depression and anxiety from  baseline.  Continue Cymbalta 60 mg daily.  Previous trial of BuSpar resulted in intolerable sedation during the day.  Discontinue Ambien, add mirtazapine 7.5 to 15 mg nightly before bed.  I am hopeful that this will improve mood in addition to helping her sleep.  Follow-up in about 6 weeks to adjust as needed.   Psychophysiological insomnia Assessment & Plan: Discontinue Ambien.  Start mirtazapine 7.5 to 15 mg nightly before bed.  Orders: -     Mirtazapine; Take 1-2 tablet (7.5-15 mg total) by mouth at bedtime.  Dispense: 90 tablet; Refill: 1  Multiple joint pain Assessment & Plan: Patient's age and presentation less likely to be osteoarthritis.  I am concerned for a rheumatologic or autoimmune condition like rheumatoid arthritis or polymyalgia rheumatica.  Starting workup with ESR/CRP and ANA.  Will continue workup as indicated by results.  Orders: -     Celecoxib; TAKE 1 CAPSULE BY MOUTH TWICE A DAY  Dispense: 60 capsule; Refill: 1 -     C-reactive protein -     Sedimentation rate -     ANA w/Reflex if Positive  Patient will return within the next week for fasting lab work and Pap smear.  Return in about 6 weeks (around 02/04/2023) for follow-up for anxiety and new medication (Remeron).   Melida Quitter, PA

## 2022-12-24 NOTE — Assessment & Plan Note (Signed)
Patient's age and presentation less likely to be osteoarthritis.  I am concerned for a rheumatologic or autoimmune condition like rheumatoid arthritis or polymyalgia rheumatica.  Starting workup with ESR/CRP and ANA.  Will continue workup as indicated by results.

## 2022-12-25 NOTE — Telephone Encounter (Signed)
Response from insurance states PA not required.

## 2022-12-30 ENCOUNTER — Other Ambulatory Visit: Payer: Self-pay | Admitting: Nurse Practitioner

## 2022-12-30 DIAGNOSIS — F909 Attention-deficit hyperactivity disorder, unspecified type: Secondary | ICD-10-CM

## 2022-12-30 MED ORDER — AMPHETAMINE-DEXTROAMPHETAMINE 30 MG PO TABS
ORAL_TABLET | ORAL | 0 refills | Status: DC
Start: 2023-01-28 — End: 2023-01-04

## 2022-12-30 MED ORDER — AMPHETAMINE-DEXTROAMPHET ER 30 MG PO CP24
30.0000 mg | ORAL_CAPSULE | ORAL | 0 refills | Status: DC
Start: 2022-12-30 — End: 2023-02-02

## 2022-12-31 ENCOUNTER — Other Ambulatory Visit: Payer: Medicaid Other

## 2022-12-31 DIAGNOSIS — Z1329 Encounter for screening for other suspected endocrine disorder: Secondary | ICD-10-CM | POA: Diagnosis not present

## 2022-12-31 DIAGNOSIS — F411 Generalized anxiety disorder: Secondary | ICD-10-CM | POA: Diagnosis not present

## 2022-12-31 DIAGNOSIS — Z0000 Encounter for general adult medical examination without abnormal findings: Secondary | ICD-10-CM

## 2022-12-31 DIAGNOSIS — Z13228 Encounter for screening for other metabolic disorders: Secondary | ICD-10-CM | POA: Diagnosis not present

## 2022-12-31 DIAGNOSIS — Z1321 Encounter for screening for nutritional disorder: Secondary | ICD-10-CM | POA: Diagnosis not present

## 2022-12-31 DIAGNOSIS — Z130 Encounter for screening for diseases of the blood and blood-forming organs and certain disorders involving the immune mechanism: Secondary | ICD-10-CM | POA: Diagnosis not present

## 2022-12-31 DIAGNOSIS — F339 Major depressive disorder, recurrent, unspecified: Secondary | ICD-10-CM | POA: Diagnosis not present

## 2023-01-01 ENCOUNTER — Encounter: Payer: Self-pay | Admitting: Family Medicine

## 2023-01-01 DIAGNOSIS — F909 Attention-deficit hyperactivity disorder, unspecified type: Secondary | ICD-10-CM

## 2023-01-01 LAB — CBC WITH DIFFERENTIAL/PLATELET
Basophils Absolute: 0 10*3/uL (ref 0.0–0.2)
Basos: 1 %
EOS (ABSOLUTE): 0.2 10*3/uL (ref 0.0–0.4)
Eos: 3 %
Hematocrit: 36.5 % (ref 34.0–46.6)
Hemoglobin: 12.1 g/dL (ref 11.1–15.9)
Immature Grans (Abs): 0 10*3/uL (ref 0.0–0.1)
Immature Granulocytes: 1 %
Lymphocytes Absolute: 1.2 10*3/uL (ref 0.7–3.1)
Lymphs: 21 %
MCH: 32.7 pg (ref 26.6–33.0)
MCHC: 33.2 g/dL (ref 31.5–35.7)
MCV: 99 fL — ABNORMAL HIGH (ref 79–97)
Monocytes Absolute: 0.5 10*3/uL (ref 0.1–0.9)
Monocytes: 9 %
Neutrophils Absolute: 3.6 10*3/uL (ref 1.4–7.0)
Neutrophils: 65 %
Platelets: 347 10*3/uL (ref 150–450)
RBC: 3.7 x10E6/uL — ABNORMAL LOW (ref 3.77–5.28)
RDW: 12.2 % (ref 11.7–15.4)
WBC: 5.6 10*3/uL (ref 3.4–10.8)

## 2023-01-01 LAB — COMPREHENSIVE METABOLIC PANEL
ALT: 21 IU/L (ref 0–32)
AST: 20 IU/L (ref 0–40)
Albumin/Globulin Ratio: 1.9 (ref 1.2–2.2)
Albumin: 4.2 g/dL (ref 3.9–4.9)
Alkaline Phosphatase: 62 IU/L (ref 44–121)
BUN/Creatinine Ratio: 28 — ABNORMAL HIGH (ref 9–23)
BUN: 17 mg/dL (ref 6–24)
Bilirubin Total: <0.2 mg/dL (ref 0.0–1.2)
CO2: 23 mmol/L (ref 20–29)
Calcium: 8.9 mg/dL (ref 8.7–10.2)
Chloride: 101 mmol/L (ref 96–106)
Creatinine, Ser: 0.6 mg/dL (ref 0.57–1.00)
Globulin, Total: 2.2 g/dL (ref 1.5–4.5)
Glucose: 106 mg/dL — ABNORMAL HIGH (ref 70–99)
Potassium: 4 mmol/L (ref 3.5–5.2)
Sodium: 135 mmol/L (ref 134–144)
Total Protein: 6.4 g/dL (ref 6.0–8.5)
eGFR: 115 mL/min/{1.73_m2} (ref >59)

## 2023-01-01 LAB — HEMOGLOBIN A1C
Est. average glucose Bld gHb Est-mCnc: 111 mg/dL
Hgb A1c MFr Bld: 5.5 % (ref 4.8–5.6)

## 2023-01-01 LAB — LIPID PANEL
Chol/HDL Ratio: 3.2 ratio (ref 0.0–4.4)
Cholesterol, Total: 196 mg/dL (ref 100–199)
HDL: 62 mg/dL (ref >39)
LDL Chol Calc (NIH): 112 mg/dL — ABNORMAL HIGH (ref 0–99)
Triglycerides: 127 mg/dL (ref 0–149)
VLDL Cholesterol Cal: 22 mg/dL (ref 5–40)

## 2023-01-01 LAB — TSH: TSH: 2.18 u[IU]/mL (ref 0.450–4.500)

## 2023-01-01 LAB — T4, FREE: Free T4: 1.04 ng/dL (ref 0.82–1.77)

## 2023-01-04 MED ORDER — AMPHETAMINE-DEXTROAMPHETAMINE 30 MG PO TABS
ORAL_TABLET | ORAL | 0 refills | Status: DC
Start: 2023-01-04 — End: 2023-02-02

## 2023-01-06 DIAGNOSIS — Z419 Encounter for procedure for purposes other than remedying health state, unspecified: Secondary | ICD-10-CM | POA: Diagnosis not present

## 2023-01-13 ENCOUNTER — Other Ambulatory Visit: Payer: Medicaid Other

## 2023-01-22 ENCOUNTER — Other Ambulatory Visit: Payer: Self-pay | Admitting: Family Medicine

## 2023-01-22 DIAGNOSIS — F5104 Psychophysiologic insomnia: Secondary | ICD-10-CM

## 2023-01-22 DIAGNOSIS — F339 Major depressive disorder, recurrent, unspecified: Secondary | ICD-10-CM

## 2023-02-02 ENCOUNTER — Other Ambulatory Visit: Payer: Self-pay | Admitting: Family Medicine

## 2023-02-02 DIAGNOSIS — F909 Attention-deficit hyperactivity disorder, unspecified type: Secondary | ICD-10-CM

## 2023-02-03 MED ORDER — AMPHETAMINE-DEXTROAMPHETAMINE 30 MG PO TABS
ORAL_TABLET | ORAL | 0 refills | Status: DC
Start: 2023-02-03 — End: 2023-03-31

## 2023-02-03 MED ORDER — AMPHETAMINE-DEXTROAMPHET ER 30 MG PO CP24
30.0000 mg | ORAL_CAPSULE | ORAL | 0 refills | Status: DC
Start: 2023-02-03 — End: 2023-03-31

## 2023-02-04 ENCOUNTER — Ambulatory Visit: Payer: Medicaid Other | Admitting: Family Medicine

## 2023-02-06 DIAGNOSIS — Z419 Encounter for procedure for purposes other than remedying health state, unspecified: Secondary | ICD-10-CM | POA: Diagnosis not present

## 2023-02-08 ENCOUNTER — Other Ambulatory Visit: Payer: Medicaid Other

## 2023-02-15 ENCOUNTER — Other Ambulatory Visit: Payer: Self-pay | Admitting: Family Medicine

## 2023-02-15 DIAGNOSIS — Z1231 Encounter for screening mammogram for malignant neoplasm of breast: Secondary | ICD-10-CM

## 2023-02-17 ENCOUNTER — Ambulatory Visit (INDEPENDENT_AMBULATORY_CARE_PROVIDER_SITE_OTHER): Payer: Medicaid Other

## 2023-02-17 DIAGNOSIS — Z1231 Encounter for screening mammogram for malignant neoplasm of breast: Secondary | ICD-10-CM | POA: Diagnosis not present

## 2023-03-08 ENCOUNTER — Other Ambulatory Visit: Payer: Self-pay

## 2023-03-08 DIAGNOSIS — M2550 Pain in unspecified joint: Secondary | ICD-10-CM

## 2023-03-08 DIAGNOSIS — Z0000 Encounter for general adult medical examination without abnormal findings: Secondary | ICD-10-CM

## 2023-03-08 DIAGNOSIS — Z130 Encounter for screening for diseases of the blood and blood-forming organs and certain disorders involving the immune mechanism: Secondary | ICD-10-CM

## 2023-03-08 DIAGNOSIS — Z1321 Encounter for screening for nutritional disorder: Secondary | ICD-10-CM

## 2023-03-08 DIAGNOSIS — Z419 Encounter for procedure for purposes other than remedying health state, unspecified: Secondary | ICD-10-CM | POA: Diagnosis not present

## 2023-03-12 ENCOUNTER — Other Ambulatory Visit: Payer: Medicaid Other

## 2023-03-18 ENCOUNTER — Ambulatory Visit: Payer: Medicaid Other | Admitting: Family Medicine

## 2023-03-18 NOTE — Progress Notes (Deleted)
   Established Patient Office Visit  Subjective   Patient ID: Abigail Smith, female    DOB: 1981-04-22  Age: 42 y.o. MRN: [id number]  No chief complaint on file.   HPI Abigail Smith is a 42 y.o. female presenting today for follow up of ***.  ROS Negative unless otherwise noted in HPI   Objective:     LMP 02/02/2023   Physical Exam   No results found for any visits on 03/18/23.   Assessment & Plan:  There are no diagnoses linked to this encounter.  No follow-ups on file.    Melida Quitter, PA

## 2023-03-31 ENCOUNTER — Other Ambulatory Visit: Payer: Self-pay | Admitting: Family Medicine

## 2023-03-31 DIAGNOSIS — F909 Attention-deficit hyperactivity disorder, unspecified type: Secondary | ICD-10-CM

## 2023-04-01 MED ORDER — AMPHETAMINE-DEXTROAMPHET ER 30 MG PO CP24
30.0000 mg | ORAL_CAPSULE | ORAL | 0 refills | Status: DC
Start: 2023-04-01 — End: 2023-08-18

## 2023-04-01 MED ORDER — AMPHETAMINE-DEXTROAMPHETAMINE 30 MG PO TABS
ORAL_TABLET | ORAL | 0 refills | Status: DC
Start: 2023-04-01 — End: 2023-04-19

## 2023-04-08 ENCOUNTER — Telehealth: Payer: Medicaid Other | Admitting: Family Medicine

## 2023-04-08 DIAGNOSIS — Z419 Encounter for procedure for purposes other than remedying health state, unspecified: Secondary | ICD-10-CM | POA: Diagnosis not present

## 2023-04-12 ENCOUNTER — Other Ambulatory Visit: Payer: Self-pay | Admitting: Nurse Practitioner

## 2023-04-12 ENCOUNTER — Other Ambulatory Visit: Payer: Self-pay | Admitting: Family Medicine

## 2023-04-12 DIAGNOSIS — F411 Generalized anxiety disorder: Secondary | ICD-10-CM

## 2023-04-12 DIAGNOSIS — M2550 Pain in unspecified joint: Secondary | ICD-10-CM

## 2023-04-12 DIAGNOSIS — F5104 Psychophysiologic insomnia: Secondary | ICD-10-CM

## 2023-04-14 ENCOUNTER — Telehealth (INDEPENDENT_AMBULATORY_CARE_PROVIDER_SITE_OTHER): Payer: Medicaid Other | Admitting: Family Medicine

## 2023-04-14 ENCOUNTER — Telehealth: Payer: Self-pay

## 2023-04-14 DIAGNOSIS — F339 Major depressive disorder, recurrent, unspecified: Secondary | ICD-10-CM

## 2023-04-14 NOTE — Telephone Encounter (Signed)
Called pt 3rd attempt VM is full unable to LVM

## 2023-04-14 NOTE — Progress Notes (Signed)
pt did not answer on multiple attempts to reach her.  [phone] and [phone] numbers were both tried.

## 2023-04-19 ENCOUNTER — Other Ambulatory Visit: Payer: Self-pay | Admitting: Family Medicine

## 2023-04-19 DIAGNOSIS — F909 Attention-deficit hyperactivity disorder, unspecified type: Secondary | ICD-10-CM

## 2023-04-20 MED ORDER — AMPHETAMINE-DEXTROAMPHETAMINE 30 MG PO TABS
ORAL_TABLET | ORAL | 0 refills | Status: DC
Start: 2023-04-20 — End: 2023-08-18

## 2023-05-09 DIAGNOSIS — Z419 Encounter for procedure for purposes other than remedying health state, unspecified: Secondary | ICD-10-CM | POA: Diagnosis not present

## 2023-05-26 ENCOUNTER — Other Ambulatory Visit: Payer: Self-pay | Admitting: Family Medicine

## 2023-05-26 DIAGNOSIS — F5104 Psychophysiologic insomnia: Secondary | ICD-10-CM

## 2023-05-26 DIAGNOSIS — F339 Major depressive disorder, recurrent, unspecified: Secondary | ICD-10-CM

## 2023-06-01 ENCOUNTER — Ambulatory Visit: Payer: Medicaid Other | Admitting: Family Medicine

## 2023-06-03 ENCOUNTER — Encounter: Payer: Self-pay | Admitting: Family Medicine

## 2023-06-03 ENCOUNTER — Ambulatory Visit: Payer: Medicaid Other | Admitting: Family Medicine

## 2023-06-08 DIAGNOSIS — Z419 Encounter for procedure for purposes other than remedying health state, unspecified: Secondary | ICD-10-CM | POA: Diagnosis not present

## 2023-06-14 ENCOUNTER — Ambulatory Visit: Payer: Medicaid Other | Admitting: Family Medicine

## 2023-06-29 ENCOUNTER — Institutional Professional Consult (permissible substitution): Payer: Medicaid Other | Admitting: Plastic Surgery

## 2023-07-08 ENCOUNTER — Telehealth: Payer: Self-pay | Admitting: *Deleted

## 2023-07-08 NOTE — Telephone Encounter (Signed)
Pt called and said she needed to schedule appointment for med refills and it is scheduled on 08/18/23, while on phone she wanted to know if this could be a video visit or does it have to be in person.  Told her that I would schedule in person and would ask provider.

## 2023-07-09 DIAGNOSIS — Z419 Encounter for procedure for purposes other than remedying health state, unspecified: Secondary | ICD-10-CM | POA: Diagnosis not present

## 2023-07-09 NOTE — Telephone Encounter (Signed)
It will need to be in person this time

## 2023-07-11 ENCOUNTER — Other Ambulatory Visit: Payer: Self-pay | Admitting: Family Medicine

## 2023-07-11 DIAGNOSIS — M2550 Pain in unspecified joint: Secondary | ICD-10-CM

## 2023-07-12 ENCOUNTER — Ambulatory Visit: Payer: Medicaid Other | Admitting: Family Medicine

## 2023-07-12 NOTE — Telephone Encounter (Signed)
Tried to contact pt to inform her of below, will send mychart message to let her know about her upcoming appointment.

## 2023-08-08 DIAGNOSIS — Z419 Encounter for procedure for purposes other than remedying health state, unspecified: Secondary | ICD-10-CM | POA: Diagnosis not present

## 2023-08-18 ENCOUNTER — Encounter: Payer: Self-pay | Admitting: Family Medicine

## 2023-08-18 ENCOUNTER — Ambulatory Visit (INDEPENDENT_AMBULATORY_CARE_PROVIDER_SITE_OTHER): Payer: Medicaid Other | Admitting: Family Medicine

## 2023-08-18 VITALS — BP 127/75 | HR 80 | Resp 18 | Ht 68.0 in | Wt 216.0 lb

## 2023-08-18 DIAGNOSIS — F5104 Psychophysiologic insomnia: Secondary | ICD-10-CM | POA: Diagnosis not present

## 2023-08-18 DIAGNOSIS — L209 Atopic dermatitis, unspecified: Secondary | ICD-10-CM

## 2023-08-18 DIAGNOSIS — Z23 Encounter for immunization: Secondary | ICD-10-CM | POA: Diagnosis not present

## 2023-08-18 DIAGNOSIS — F909 Attention-deficit hyperactivity disorder, unspecified type: Secondary | ICD-10-CM

## 2023-08-18 DIAGNOSIS — F339 Major depressive disorder, recurrent, unspecified: Secondary | ICD-10-CM

## 2023-08-18 DIAGNOSIS — F411 Generalized anxiety disorder: Secondary | ICD-10-CM | POA: Diagnosis not present

## 2023-08-18 MED ORDER — AMPHETAMINE-DEXTROAMPHETAMINE 30 MG PO TABS: ORAL_TABLET | ORAL | 0 refills | Status: DC

## 2023-08-18 MED ORDER — TRIAMCINOLONE ACETONIDE 0.5 % EX OINT: TOPICAL_OINTMENT | CUTANEOUS | 2 refills | Status: DC

## 2023-08-18 MED ORDER — AMPHETAMINE-DEXTROAMPHETAMINE 30 MG PO TABS: 30.0000 mg | ORAL_TABLET | Freq: Every morning | ORAL | 0 refills | Status: DC

## 2023-08-18 MED ORDER — AMPHETAMINE-DEXTROAMPHET ER 30 MG PO CP24
30.0000 mg | ORAL_CAPSULE | ORAL | 0 refills | Status: DC
Start: 2023-08-18 — End: 2023-08-18

## 2023-08-18 MED ORDER — DOXEPIN HCL 25 MG PO CAPS
25.0000 mg | ORAL_CAPSULE | Freq: Every day | ORAL | 1 refills | Status: DC
Start: 2023-08-18 — End: 2023-09-27

## 2023-08-18 MED ORDER — ZOLPIDEM TARTRATE ER 12.5 MG PO TBCR
12.5000 mg | EXTENDED_RELEASE_TABLET | Freq: Every evening | ORAL | 1 refills | Status: DC | PRN
Start: 2023-08-18 — End: 2023-11-01

## 2023-08-18 MED ORDER — AMPHETAMINE-DEXTROAMPHET ER 30 MG PO CP24: 30.0000 mg | ORAL_CAPSULE | ORAL | 0 refills | Status: DC

## 2023-08-18 MED ORDER — AMPHETAMINE-DEXTROAMPHET ER 30 MG PO CP24
30.0000 mg | ORAL_CAPSULE | ORAL | 0 refills | Status: DC
Start: 2023-09-17 — End: 2023-08-18

## 2023-08-18 NOTE — Assessment & Plan Note (Signed)
Continue Cymbalta 60 mg daily, add doxepin 25 mg daily.

## 2023-08-18 NOTE — Assessment & Plan Note (Signed)
Stable, well-controlled.  Continue Adderall 30 mg daily.  PDMP reviewed, no aberrancies.  Overdose risk score 310.  Will continue to monitor.

## 2023-08-18 NOTE — Assessment & Plan Note (Signed)
Continue Cymbalta 60 mg daily.  Start doxepin 25 mg daily for sleep and mood.

## 2023-08-18 NOTE — Progress Notes (Addendum)
Established Patient Office Visit  Subjective   Patient ID: Abigail Smith, female    DOB: 1981-06-14  Age: 42 y.o. MRN: [id number]  Chief Complaint  Patient presents with   ADHD    HPI Abigail Smith is a 42 y.o. female presenting today for follow up of ADHD, mood. ADHD: Reports symptoms are well controlled on Adderall 30 mg daily. Denies any problems with insomnia, chest pain, palpitations, or SOB.   Mood: Patient is here to follow up for depression and anxiety, currently managing with Cymbalta. Taking medication without side effects, reports excellent compliance with treatment. Denies mood changes or SI/HI. She feels mood is slightly improved since last visit in regards to depression but anxiety remains uncontrolled and insomnia remains uncontrolled.  The mirtazapine caused a significant increase in appetite which was a big concern, so she does not want to take it anymore.     08/18/2023    2:38 PM 12/24/2022    2:27 PM 06/22/2022    3:32 PM  Depression screen PHQ 2/9  Decreased Interest 0 1 1  Down, Depressed, Hopeless 0 2 1  PHQ - 2 Score 0 3 2  Altered sleeping 3 3 2   Tired, decreased energy 2 3 1   Change in appetite 0 3 0  Feeling bad or failure about yourself  1 3 1   Trouble concentrating 3 3 2   Moving slowly or fidgety/restless 1 2 1   Suicidal thoughts 0 0 0  PHQ-9 Score 10 20 9   Difficult doing work/chores Very difficult Very difficult        08/18/2023    2:38 PM 12/24/2022    2:32 PM 06/22/2022    3:32 PM 03/04/2022    2:48 PM  GAD 7 : Generalized Anxiety Score  Nervous, Anxious, on Edge 3 3 1 2   Control/stop worrying 3 3 1 2   Worry too much - different things 3 3 2  0  Trouble relaxing 3 3 2 2   Restless 3 3 2 2   Easily annoyed or irritable 3 3 1  0  Afraid - awful might happen 3 3 2 2   Total GAD 7 Score 21 21 11 10   Anxiety Difficulty Extremely difficult Extremely difficult  Extremely difficult    Outpatient Medications Prior to Visit  Medication Sig    celecoxib (CELEBREX) 200 MG capsule TAKE 1 CAPSULE BY MOUTH TWICE A DAY   DULoxetine (CYMBALTA) 60 MG capsule TAKE 1 CAPSULE BY MOUTH 2 TIMES DAILY.   methocarbamol (ROBAXIN) 750 MG tablet TAKE 1 TABLET BY MOUTH 3 TIMES DAILY.   [DISCONTINUED] amphetamine-dextroamphetamine (ADDERALL XR) 30 MG 24 hr capsule Take 1 capsule (30 mg total) by mouth every morning.   [DISCONTINUED] amphetamine-dextroamphetamine (ADDERALL) 30 MG tablet Take 1 to 2 tablets po in the afternoon as needed.   [DISCONTINUED] triamcinolone ointment (KENALOG) 0.5 % APPLY TO AFFECTED AREA TWICE A DAY   [DISCONTINUED] zolpidem (AMBIEN CR) 12.5 MG CR tablet Take 1 tablet (12.5 mg total) by mouth at bedtime as needed for sleep.   [DISCONTINUED] mirtazapine (REMERON) 7.5 MG tablet TAKE 1-2 TABLET (7.5-15 MG TOTAL) BY MOUTH AT BEDTIME.   No facility-administered medications prior to visit.    ROS Negative unless otherwise noted in HPI   Objective:     BP 127/75 (BP Location: Left Arm, Patient Position: Sitting, Cuff Size: Normal)   Pulse 80   Resp 18   Ht 5\' 8"  (1.727 m)   Wt 216 lb (98 kg)   SpO2 98%  BMI 32.84 kg/m   Physical Exam Constitutional:      General: She is not in acute distress.    Appearance: Normal appearance.  HENT:     Head: Normocephalic and atraumatic.  Cardiovascular:     Rate and Rhythm: Normal rate and regular rhythm.     Heart sounds: Normal heart sounds.  Pulmonary:     Effort: Pulmonary effort is normal. No respiratory distress.  Musculoskeletal:     Cervical back: Normal range of motion.  Neurological:     General: No focal deficit present.     Mental Status: She is alert and oriented to person, place, and time. Mental status is at baseline.  Psychiatric:        Thought Content: Thought content normal.        Judgment: Judgment normal.     Comments: Anxious mood, anxious affect     Assessment & Plan:  Attention deficit hyperactivity disorder (ADHD), unspecified ADHD  type Assessment & Plan: Stable, well-controlled.  Continue Adderall 30 mg daily.  PDMP reviewed, no aberrancies.  Overdose risk score 310.  Will continue to monitor.  Orders: -     Amphetamine-Dextroamphetamine; Take 1 tablet by mouth in the morning.  Dispense: 30 tablet; Refill: 0 -     Amphetamine-Dextroamphetamine; Take 1 tablet by mouth in the morning.  Dispense: 30 tablet; Refill: 0 -     Amphetamine-Dextroamphetamine; Take 1 tablet by mouth in the morning.  Dispense: 30 tablet; Refill: 0  Depression, recurrent (HCC) Assessment & Plan: Continue Cymbalta 60 mg daily, add doxepin 25 mg daily.  Orders: -     Doxepin HCl; Take 1 capsule (25 mg total) by mouth at bedtime. May increase to 2 tablets (50 mg total) at bedtime after 7 days if needed.  Dispense: 90 capsule; Refill: 1  GAD (generalized anxiety disorder) Assessment & Plan: Continue Cymbalta 60 mg daily.  Start doxepin 25 mg daily for sleep and mood.  Orders: -     Doxepin HCl; Take 1 capsule (25 mg total) by mouth at bedtime. May increase to 2 tablets (50 mg total) at bedtime after 7 days if needed.  Dispense: 90 capsule; Refill: 1  Psychophysiological insomnia Assessment & Plan: Discontinue mirtazapine due to side effects.  Initiate trial doxepin 25 mg daily.  Can increase to 50 mg daily after 1 week, and again to 50 mg daily after an additional week.  Maximum daily dose 300 mg.  Also provided 10 tablets zolpidem CR for breakthrough insomnia.  She was previously using zolpidem immediate release but was changed to extended release formulation as it is the recommended treatment for chronic insomnia.  She has now tried and failed mirtazapine and is in the midst of a trial of doxepin and an alternative to move away from sedative hypnotics.  Orders: -     Doxepin HCl; Take 1 capsule (25 mg total) by mouth at bedtime. May increase to 2 tablets (50 mg total) at bedtime after 7 days if needed.  Dispense: 90 capsule; Refill: 1 -      Zolpidem Tartrate ER; Take 1 tablet (12.5 mg total) by mouth at bedtime as needed for sleep.  Dispense: 10 tablet; Refill: 1  Atopic dermatitis, unspecified type -     Triamcinolone Acetonide; APPLY TO AFFECTED AREA TWICE A DAY  Dispense: 30 g; Refill: 2  Need for influenza vaccination -     Flu vaccine trivalent PF, 6mos and older(Flulaval,Afluria,Fluarix,Fluzone)    Return in about 2 months (  around 10/19/2023) for follow-up for sleep and anxiety.    Melida Quitter, PA

## 2023-08-18 NOTE — Patient Instructions (Signed)
DOXEPIN: Doxepin should help with both sleep and anxiety. START 1 tablet (25 mg) at bedtime nightly. After 1 week, if you need to you can increase to 2 tablets (50 mg) at bedtime. After 1 week taking 50 mg, you can again increase to 3 tablets (75 mg) at bedtime.

## 2023-08-18 NOTE — Assessment & Plan Note (Signed)
Discontinue mirtazapine due to side effects.  Initiate trial doxepin 25 mg daily.  Can increase to 50 mg daily after 1 week, and again to 50 mg daily after an additional week.  Maximum daily dose 300 mg.  Also provided 10 tablets Ambien for breakthrough insomnia.

## 2023-08-19 ENCOUNTER — Telehealth: Payer: Self-pay

## 2023-08-19 MED ORDER — AMPHETAMINE-DEXTROAMPHETAMINE 30 MG PO TABS: ORAL_TABLET | ORAL | 0 refills | Status: DC

## 2023-08-19 NOTE — Telephone Encounter (Signed)
She has been taking zolpidem (AMBIEN CR) 12.5 MG CR tablet since 07/23/2022.  Prior to that, she was using regular zolpidem 10 mg daily which is not recommended for long-term use, the CR formulation is the recommended formulation for long-term use of zolpidem.

## 2023-08-19 NOTE — Addendum Note (Signed)
Addended by: Saralyn Pilar on: 08/19/2023 04:15 PM   Modules accepted: Orders

## 2023-08-19 NOTE — Telephone Encounter (Signed)
Copied from CRM [id number]. Topic: Clinical - Medication Question >> Aug 19, 2023 10:33 AM Abigail Smith wrote: Reason for CRM: [facility] called in to verify what the patient has tried in the past for insomnia other than zolpidem (AMBIEN CR) 12.5 MG CR tablet stated once office have received determination letter it will list all of medication  and to please choose all of what patient has taken before and resubmit

## 2023-08-19 NOTE — Telephone Encounter (Signed)
A determination has not been received from patient's insurance provider. Once denied determination is made, an appeal will be made.

## 2023-08-20 ENCOUNTER — Telehealth: Payer: Self-pay

## 2023-08-20 NOTE — Telephone Encounter (Signed)
Just FYI.

## 2023-08-20 NOTE — Telephone Encounter (Signed)
Message has been addressed in previous message.

## 2023-08-20 NOTE — Telephone Encounter (Signed)
Copied from CRM [id number]. Topic: Medical Record Request - Provider/Facility Request >> Aug 20, 2023  9:50 AM Clayton Bibles wrote: Reason for CRM: [facility] Medicaid has requested through fax medical records - Please call April at [phone]

## 2023-08-20 NOTE — Telephone Encounter (Signed)
Spoke with April at [facility] to provide additional information regarding Zolpidem PA.

## 2023-08-20 NOTE — Telephone Encounter (Signed)
Spoke with April and provided additional information regarding Zolpidem.

## 2023-09-03 ENCOUNTER — Other Ambulatory Visit: Payer: Self-pay | Admitting: Family Medicine

## 2023-09-03 DIAGNOSIS — M2550 Pain in unspecified joint: Secondary | ICD-10-CM

## 2023-09-03 MED ORDER — METHOCARBAMOL 750 MG PO TABS: 750.0000 mg | ORAL_TABLET | Freq: Three times a day (TID) | ORAL | 3 refills | Status: DC

## 2023-09-08 DIAGNOSIS — Z419 Encounter for procedure for purposes other than remedying health state, unspecified: Secondary | ICD-10-CM | POA: Diagnosis not present

## 2023-09-14 ENCOUNTER — Other Ambulatory Visit: Payer: Self-pay | Admitting: Family Medicine

## 2023-09-14 DIAGNOSIS — M2550 Pain in unspecified joint: Secondary | ICD-10-CM

## 2023-09-24 ENCOUNTER — Other Ambulatory Visit: Payer: Self-pay | Admitting: Family Medicine

## 2023-09-24 DIAGNOSIS — F411 Generalized anxiety disorder: Secondary | ICD-10-CM

## 2023-09-24 DIAGNOSIS — F5104 Psychophysiologic insomnia: Secondary | ICD-10-CM

## 2023-09-24 DIAGNOSIS — F339 Major depressive disorder, recurrent, unspecified: Secondary | ICD-10-CM

## 2023-10-09 DIAGNOSIS — Z419 Encounter for procedure for purposes other than remedying health state, unspecified: Secondary | ICD-10-CM | POA: Diagnosis not present

## 2023-10-14 ENCOUNTER — Encounter: Payer: Self-pay | Admitting: Family Medicine

## 2023-10-19 ENCOUNTER — Ambulatory Visit: Payer: Medicaid Other | Admitting: Family Medicine

## 2023-10-30 ENCOUNTER — Other Ambulatory Visit: Payer: Self-pay | Admitting: Family Medicine

## 2023-10-30 DIAGNOSIS — F5104 Psychophysiologic insomnia: Secondary | ICD-10-CM

## 2023-11-06 DIAGNOSIS — Z419 Encounter for procedure for purposes other than remedying health state, unspecified: Secondary | ICD-10-CM | POA: Diagnosis not present

## 2023-12-18 DIAGNOSIS — Z419 Encounter for procedure for purposes other than remedying health state, unspecified: Secondary | ICD-10-CM | POA: Diagnosis not present

## 2023-12-29 ENCOUNTER — Other Ambulatory Visit: Payer: Self-pay | Admitting: Family Medicine

## 2023-12-29 DIAGNOSIS — F5104 Psychophysiologic insomnia: Secondary | ICD-10-CM

## 2024-01-17 DIAGNOSIS — Z419 Encounter for procedure for purposes other than remedying health state, unspecified: Secondary | ICD-10-CM | POA: Diagnosis not present

## 2024-01-27 ENCOUNTER — Other Ambulatory Visit: Payer: Self-pay | Admitting: Family Medicine

## 2024-01-27 DIAGNOSIS — M2550 Pain in unspecified joint: Secondary | ICD-10-CM

## 2024-02-17 DIAGNOSIS — Z419 Encounter for procedure for purposes other than remedying health state, unspecified: Secondary | ICD-10-CM | POA: Diagnosis not present

## 2024-02-25 ENCOUNTER — Other Ambulatory Visit: Payer: Self-pay | Admitting: Family Medicine

## 2024-02-25 DIAGNOSIS — F5104 Psychophysiologic insomnia: Secondary | ICD-10-CM

## 2024-03-15 ENCOUNTER — Other Ambulatory Visit: Payer: Self-pay | Admitting: Family Medicine

## 2024-03-15 ENCOUNTER — Encounter: Payer: Self-pay | Admitting: Physician Assistant

## 2024-03-15 ENCOUNTER — Ambulatory Visit (INDEPENDENT_AMBULATORY_CARE_PROVIDER_SITE_OTHER): Admitting: Physician Assistant

## 2024-03-15 VITALS — BP 117/77 | HR 88 | Ht 68.0 in | Wt 194.4 lb

## 2024-03-15 DIAGNOSIS — F339 Major depressive disorder, recurrent, unspecified: Secondary | ICD-10-CM | POA: Diagnosis not present

## 2024-03-15 DIAGNOSIS — F5104 Psychophysiologic insomnia: Secondary | ICD-10-CM

## 2024-03-15 DIAGNOSIS — L309 Dermatitis, unspecified: Secondary | ICD-10-CM | POA: Diagnosis not present

## 2024-03-15 DIAGNOSIS — M2550 Pain in unspecified joint: Secondary | ICD-10-CM

## 2024-03-15 DIAGNOSIS — F411 Generalized anxiety disorder: Secondary | ICD-10-CM

## 2024-03-15 DIAGNOSIS — F909 Attention-deficit hyperactivity disorder, unspecified type: Secondary | ICD-10-CM | POA: Diagnosis not present

## 2024-03-15 MED ORDER — CLOBETASOL PROPIONATE 0.05 % EX CREA: 1.0000 | TOPICAL_CREAM | Freq: Two times a day (BID) | CUTANEOUS | 0 refills | Status: AC

## 2024-03-15 MED ORDER — PIMECROLIMUS 1 % EX CREA: TOPICAL_CREAM | Freq: Two times a day (BID) | CUTANEOUS | 0 refills | Status: DC

## 2024-03-15 NOTE — Progress Notes (Signed)
 New patient visit   Patient: Abigail Smith   DOB: Jul 06, 1981   43 y.o. Female  MRN: [id number] Visit Date: 03/15/2024  Today's healthcare provider: Manuelita Flatness, PA-C   Chief Complaint  Patient presents with   Establish Care    Wants to discuss Celebrex  and get labs done that were not done in April   Subjective    Abigail Smith is a 42 y.o. female who presents today as a new patient to establish care.   Discussed the use of AI scribe software for clinical note transcription with the patient, who gave verbal consent to proceed.  History of Present Illness   The patient presents with persistent joint pain and stiffness.  Joint pain and stiffness have been present for years, affecting most joints without a specific pattern. Symptoms vary daily. She was about to undergo an autoimmune w/u with her last pcp.  Celebrex  is taken twice daily with some improvement, but there are concerns about long-term use.  Eczema affects the hands and face, managed with triamcinolone  ointment. Symptoms recur based on environmental factors, such as soap use.   Insomnia has been present since birth, managed with zolpidem  and doxepin . Doxepin  has a short window of effectiveness. Reports her insomnia symptoms are still uncontrolled.   ADHD is managed with Adderall 30 mg in the morning and 30-60 mg later in the day.      She has never seen psychiatry.   Past Medical History:  Diagnosis Date   ADHD    Anemia    Past Surgical History:  Procedure Laterality Date   BREAST REDUCTION SURGERY  2018   DILATION AND CURETTAGE OF UTERUS     EAB   Family Status  Relation Name Status   Mother  (Not Specified)   Brother  (Not Specified)   MGM  (Not Specified)   PGM  (Not Specified)   PGF  (Not Specified)  No partnership data on file   Family History  Problem Relation Age of Onset   Hypertension Mother    Heart disease Brother    Cancer Maternal Grandmother        lung   Macular degeneration  Maternal Grandmother    Cancer Paternal Grandmother        stomach   Alcohol abuse Paternal Grandfather    Cancer Paternal Grandfather        esophageal   Social History   Socioeconomic History   Marital status: Unknown    Spouse name: Not on file   Number of children: Not on file   Years of education: Not on file   Highest education level: Not on file  Occupational History   Not on file  Tobacco Use   Smoking status: Never    Passive exposure: Never   Smokeless tobacco: Never  Vaping Use   Vaping status: Never Used  Substance and Sexual Activity   Alcohol use: Not Currently    Alcohol/week: 3.0 - 4.0 standard drinks of alcohol    Types: 3 - 4 Glasses of wine per week   Drug use: No   Sexual activity: Yes    Birth control/protection: None  Other Topics Concern   Not on file  Social History Narrative   Not on file   Social Drivers of Health   Financial Resource Strain: Patient Declined (08/17/2023)   Overall Financial Resource Strain (CARDIA)    Difficulty of Paying Living Expenses: Patient declined  Food Insecurity: Patient Declined (08/17/2023)  Hunger Vital Sign    Worried About Running Out of Food in the Last Year: Patient declined    Ran Out of Food in the Last Year: Patient declined  Transportation Needs: Patient Declined (08/17/2023)   PRAPARE - Administrator, Civil Service (Medical): Patient declined    Lack of Transportation (Non-Medical): Patient declined  Physical Activity: Sufficiently Active (08/17/2023)   Exercise Vital Sign    Days of Exercise per Week: 5 days    Minutes of Exercise per Session: 40 min  Stress: Stress Concern Present (08/17/2023)   [employer] of Occupational Health - Occupational Stress Questionnaire    Feeling of Stress : Very much  Social Connections: Unknown (08/17/2023)   Social Connection and Isolation Panel    Frequency of Communication with Friends and Family: Patient declined    Frequency of Social  Gatherings with Friends and Family: Patient declined    Attends Religious Services: Patient declined    Database administrator or Organizations: Patient declined    Attends Engineer, structural: Not on file    Marital Status: Patient declined   Outpatient Medications Prior to Visit  Medication Sig   celecoxib  (CELEBREX ) 200 MG capsule TAKE 1 CAPSULE BY MOUTH TWICE A DAY   doxepin  (SINEQUAN ) 25 MG capsule TAKE 1 CAPSULE BY MOUTH AT BEDTIME. MAY INCREASE TO 2 TABLETS AT BEDTIME AFTER 7 DAYS IF NEEDED.   methocarbamol  (ROBAXIN ) 750 MG tablet Take 1 tablet (750 mg total) by mouth 3 (three) times daily.   triamcinolone  ointment (KENALOG ) 0.5 % APPLY TO AFFECTED AREA TWICE A DAY   zolpidem  (AMBIEN  CR) 12.5 MG CR tablet TAKE 1 TABLET BY MOUTH AT BEDTIME AS NEEDED FOR SLEEP   [DISCONTINUED] amphetamine -dextroamphetamine  (ADDERALL) 30 MG tablet Take 1 tablet by mouth in the morning. May also take 1-2 tablets daily as needed (Daily each afternoon PRN).   [DISCONTINUED] amphetamine -dextroamphetamine  (ADDERALL) 30 MG tablet Take 1 tablet by mouth in the morning. May also take 1-2 tablets daily as needed (Daily each afternoon PRN).   [DISCONTINUED] amphetamine -dextroamphetamine  (ADDERALL) 30 MG tablet Take 1 tablet by mouth in the morning. May also take 1-2 tablets daily as needed (Daily each afternoon PRN).   [DISCONTINUED] DULoxetine  (CYMBALTA ) 60 MG capsule TAKE 1 CAPSULE BY MOUTH 2 TIMES DAILY.   No facility-administered medications prior to visit.   Allergies  Allergen Reactions   Vicodin [Hydrocodone-Acetaminophen ] Hives   Vancomycin Other (See Comments)    Red man syndrome    Immunization History  Administered Date(s) Administered   Influenza, Seasonal, Injecte, Preservative Fre 08/18/2023   Influenza,inj,Quad PF,6+ Mos 06/04/2017   Influenza,trivalent, recombinat, inj, PF 07/01/2016   Influenza-Unspecified 06/20/2018   PFIZER(Purple Top)SARS-COV-2 Vaccination 01/12/2020,  02/02/2020   Tdap 12/30/2009, 12/02/2021    Health Maintenance  Topic Date Due   Hepatitis C Screening  Never done   Hepatitis B Vaccines (1 of 3 - 19+ 3-dose series) Never done   HPV VACCINES (1 - 3-dose SCDM series) Never done   Cervical Cancer Screening (HPV/Pap Cotest)  Never done   COVID-19 Vaccine (3 - 2024-25 season) 05/09/2023   MAMMOGRAM  02/17/2024   INFLUENZA VACCINE  04/07/2024   DTaP/Tdap/Td (3 - Td or Tdap) 12/03/2031   HIV Screening  Completed   Meningococcal B Vaccine  Aged Out    Patient Care Team: Cyndi Shaver, PA-C as PCP - General (Physician Assistant) Herchel Gloris LABOR, MD as Attending Physician (Obstetrics and Gynecology)  Review of Systems  Constitutional:  Negative for fatigue and fever.  Respiratory:  Negative for cough and shortness of breath.   Cardiovascular:  Negative for chest pain and leg swelling.  Gastrointestinal:  Negative for abdominal pain.  Musculoskeletal:  Positive for arthralgias.  Neurological:  Negative for dizziness and headaches.  Psychiatric/Behavioral:  Positive for sleep disturbance.         Objective    BP 117/77   Pulse 88   Ht 5' 8 (1.727 m)   Wt 194 lb 6.4 oz (88.2 kg)   LMP 02/16/2024 (Approximate)   BMI 29.56 kg/m     Physical Exam Constitutional:      General: She is awake.     Appearance: She is well-developed.  HENT:     Head: Normocephalic.  Eyes:     Conjunctiva/sclera: Conjunctivae normal.  Cardiovascular:     Rate and Rhythm: Normal rate and regular rhythm.     Heart sounds: Normal heart sounds.  Pulmonary:     Effort: Pulmonary effort is normal.     Breath sounds: Normal breath sounds.  Skin:    General: Skin is warm.  Neurological:     Mental Status: She is alert and oriented to person, place, and time.  Psychiatric:        Attention and Perception: Attention normal.        Mood and Affect: Mood normal.        Speech: Speech normal.        Behavior: Behavior is cooperative.      Depression Screen    03/15/2024    2:42 PM 08/18/2023    2:38 PM 12/24/2022    2:27 PM 06/22/2022    3:32 PM  PHQ 2/9 Scores  PHQ - 2 Score 0 0 3 2  PHQ- 9 Score  10 20 9    Results for orders placed or performed in visit on 03/15/24  Basic Metabolic Panel (BMET)  Result Value Ref Range   Sodium 137 135 - 145 mEq/L   Potassium 4.2 3.5 - 5.1 mEq/L   Chloride 101 96 - 112 mEq/L   CO2 27 19 - 32 mEq/L   Glucose, Bld 93 70 - 99 mg/dL   BUN 16 6 - 23 mg/dL   Creatinine, Ser 9.33 0.40 - 1.20 mg/dL   GFR 892.75 >39.99 mL/min   Calcium 9.3 8.4 - 10.5 mg/dL  Antinuclear Antib (ANA)  Result Value Ref Range   Anti Nuclear Antibody (ANA) NEGATIVE NEGATIVE  C-reactive protein  Result Value Ref Range   CRP <1.0 0.5 - 20.0 mg/dL  Sedimentation rate  Result Value Ref Range   Sed Rate 4 0 - 20 mm/hr  Rheumatoid Factor  Result Value Ref Range   Rheumatoid fact SerPl-aCnc <10 <14 IU/mL    Assessment & Plan     Multiple joint pain Assessment & Plan: Persistent joint pain and stiffness affecting most joints for years. Celebrex  provides some relief but is not a long-term solution due to potential renal and gastrointestinal side effects. - Order CRP, sed rate, ANA, and rheumatoid factor tests to evaluate for autoimmune conditions - Continue Celebrex  as needed for pain management,    Orders: -     Basic metabolic panel with GFR -     ANA -     C-reactive protein -     Sedimentation rate -     Rheumatoid factor  Eczema, unspecified type Assessment & Plan: - Prescribe clobetasol  for hands and triamcinolone  for face. Advised x 10 days at  a time, want to avoid daily steroid use   - Prescribe Elidel  (pimecrolimus ) as a non-steroid alternative, pending prior authorization - Advise on proactive use of moisturizers to prevent flares  Question on eczema vs psoriasis --- if psoriasis other systemic symptoms may align more with psoriatic arthritis   Orders: -     Clobetasol   Propionate; Apply 1 Application topically 2 (two) times daily. For up to 10 days in a row  Dispense: 30 g; Refill: 0 -     Pimecrolimus ; Apply topically 2 (two) times daily.  Dispense: 30 g; Refill: 0  GAD (generalized anxiety disorder) -     Ambulatory referral to Psychiatry  Depression, recurrent (HCC) -     Ambulatory referral to Psychiatry  Attention deficit hyperactivity disorder (ADHD), unspecified ADHD type -     Ambulatory referral to Psychiatry  Psychophysiological insomnia Assessment & Plan: Long-standing insomnia since childhood. Current regimen includes doxepin  and zolpidem , but sleep quality remains suboptimal. High dose of Adderall for ADHD may be contributing to sleep issues. Referral to psychiatry is agreed upon  Orders: -     Ambulatory referral to Psychiatry   General Health Maintenance - Verify last Pap smear date and schedule if not done within five years - Schedule mammogram as overdue   Return if symptoms worsen or fail to improve.      Manuelita Flatness, PA-C  [facility] at [facility] [phone] (phone) [phone] (fax)  [facility]

## 2024-03-16 ENCOUNTER — Other Ambulatory Visit: Payer: Self-pay | Admitting: Physician Assistant

## 2024-03-16 ENCOUNTER — Other Ambulatory Visit: Payer: Self-pay | Admitting: *Deleted

## 2024-03-16 DIAGNOSIS — F909 Attention-deficit hyperactivity disorder, unspecified type: Secondary | ICD-10-CM

## 2024-03-16 DIAGNOSIS — F411 Generalized anxiety disorder: Secondary | ICD-10-CM

## 2024-03-16 LAB — BASIC METABOLIC PANEL WITH GFR
BUN: 16 mg/dL (ref 6–23)
CO2: 27 meq/L (ref 19–32)
Calcium: 9.3 mg/dL (ref 8.4–10.5)
Chloride: 101 meq/L (ref 96–112)
Creatinine, Ser: 0.66 mg/dL (ref 0.40–1.20)
GFR: 107.24 mL/min (ref >60.00)
Glucose, Bld: 93 mg/dL (ref 70–99)
Potassium: 4.2 meq/L (ref 3.5–5.1)
Sodium: 137 meq/L (ref 135–145)

## 2024-03-16 LAB — ANA: Anti Nuclear Antibody (ANA): NEGATIVE

## 2024-03-16 LAB — C-REACTIVE PROTEIN: CRP: <1 mg/dL (ref 0.5–20.0)

## 2024-03-16 LAB — SEDIMENTATION RATE: Sed Rate: 4 mm/h (ref 0–20)

## 2024-03-16 LAB — RHEUMATOID FACTOR: Rheumatoid fact SerPl-aCnc: <10 [IU]/mL (ref <14)

## 2024-03-16 MED ORDER — AMPHETAMINE-DEXTROAMPHETAMINE 30 MG PO TABS: ORAL_TABLET | ORAL | 0 refills | Status: DC

## 2024-03-16 MED ORDER — DULOXETINE HCL 60 MG PO CPEP: 60.0000 mg | ORAL_CAPSULE | Freq: Two times a day (BID) | ORAL | 1 refills | Status: DC

## 2024-03-17 ENCOUNTER — Ambulatory Visit: Payer: Self-pay | Admitting: Physician Assistant

## 2024-03-17 ENCOUNTER — Encounter: Payer: Self-pay | Admitting: Physician Assistant

## 2024-03-17 DIAGNOSIS — L309 Dermatitis, unspecified: Secondary | ICD-10-CM | POA: Insufficient documentation

## 2024-03-17 NOTE — Assessment & Plan Note (Signed)
 Persistent joint pain and stiffness affecting most joints for years. Celebrex  provides some relief but is not a long-term solution due to potential renal and gastrointestinal side effects. - Order CRP, sed rate, ANA, and rheumatoid factor tests to evaluate for autoimmune conditions - Continue Celebrex  as needed for pain management,

## 2024-03-17 NOTE — Assessment & Plan Note (Signed)
-   Prescribe clobetasol  for hands and triamcinolone  for face. Advised x 10 days at a time, want to avoid daily steroid use   - Prescribe Elidel  (pimecrolimus ) as a non-steroid alternative, pending prior authorization - Advise on proactive use of moisturizers to prevent flares  Question on eczema vs psoriasis --- if psoriasis other systemic symptoms may align more with psoriatic arthritis

## 2024-03-17 NOTE — Assessment & Plan Note (Signed)
 Long-standing insomnia since childhood. Current regimen includes doxepin  and zolpidem , but sleep quality remains suboptimal. High dose of Adderall for ADHD may be contributing to sleep issues. Referral to psychiatry is agreed upon

## 2024-03-18 DIAGNOSIS — Z419 Encounter for procedure for purposes other than remedying health state, unspecified: Secondary | ICD-10-CM | POA: Diagnosis not present

## 2024-03-29 ENCOUNTER — Other Ambulatory Visit (HOSPITAL_COMMUNITY): Payer: Self-pay

## 2024-03-29 ENCOUNTER — Telehealth: Payer: Self-pay

## 2024-03-29 NOTE — Telephone Encounter (Signed)
 Pharmacy Patient Advocate Encounter   Received notification from CoverMyMeds that prior authorization for Elidel  1% cream  is required/requested.   Insurance verification completed.   The patient is insured through [facility] .   Per test claim: PA required; PA started via CoverMyMeds. KEY [id number] . Waiting for clinical questions to populate.

## 2024-03-30 ENCOUNTER — Other Ambulatory Visit (HOSPITAL_COMMUNITY): Payer: Self-pay

## 2024-03-30 NOTE — Telephone Encounter (Signed)
 Pharmacy Patient Advocate Encounter  Received notification from [facility] that Prior Authorization for  Elidel  1% cream has been APPROVED from 03/16/24 to 03/30/25. Ran test claim, Copay is $4. This test claim was processed through [facility]- copay amounts may vary at other pharmacies due to pharmacy/plan contracts, or as the patient moves through the different stages of their insurance plan.   PA #/Case ID/Reference #: [id number]

## 2024-03-30 NOTE — Telephone Encounter (Signed)
 PLEASE BE ADVISED Clinical questions have been answered and PA submitted.TO PLAN. PA currently Pending.

## 2024-04-04 ENCOUNTER — Other Ambulatory Visit: Payer: Self-pay | Admitting: Family Medicine

## 2024-04-04 DIAGNOSIS — M2550 Pain in unspecified joint: Secondary | ICD-10-CM

## 2024-04-05 ENCOUNTER — Other Ambulatory Visit: Payer: Self-pay | Admitting: Physician Assistant

## 2024-04-05 DIAGNOSIS — F5104 Psychophysiologic insomnia: Secondary | ICD-10-CM

## 2024-04-05 MED ORDER — ZOLPIDEM TARTRATE 5 MG PO TABS: 5.0000 mg | ORAL_TABLET | Freq: Every evening | ORAL | 0 refills | Status: DC | PRN

## 2024-04-18 DIAGNOSIS — Z419 Encounter for procedure for purposes other than remedying health state, unspecified: Secondary | ICD-10-CM | POA: Diagnosis not present

## 2024-04-23 ENCOUNTER — Other Ambulatory Visit: Payer: Self-pay | Admitting: Physician Assistant

## 2024-04-23 DIAGNOSIS — F411 Generalized anxiety disorder: Secondary | ICD-10-CM

## 2024-04-23 DIAGNOSIS — F339 Major depressive disorder, recurrent, unspecified: Secondary | ICD-10-CM

## 2024-04-23 DIAGNOSIS — F5104 Psychophysiologic insomnia: Secondary | ICD-10-CM

## 2024-04-23 DIAGNOSIS — M2550 Pain in unspecified joint: Secondary | ICD-10-CM

## 2024-04-28 ENCOUNTER — Encounter: Payer: Self-pay | Admitting: Physician Assistant

## 2024-04-28 NOTE — Telephone Encounter (Signed)
 Copied from CRM #[id number]. Topic: Clinical - Medication Refill >> Apr 28, 2024  3:43 PM Anairis L wrote: Medication: amphetamine -dextroamphetamine  (ADDERALL) 30 MG tablet  Has the patient contacted their pharmacy? Yes (Agent: If no, request that the patient contact the pharmacy for the refill. If patient does not wish to contact the pharmacy document the reason why and proceed with request.) (Agent: If yes, when and what did the pharmacy advise?)    [id number] [address], [place] - [address] [place] [address] Phone: [phone] Fax: [phone]   Is this the correct pharmacy for this prescription? Yes If no, delete pharmacy and type the correct one.   Has the prescription been filled recently? Yes  Is the patient out of the medication? Yes  Has the patient been seen for an appointment in the last year OR does the patient have an upcoming appointment? Yes  Can we respond through MyChart? Yes  Agent: Please be advised that Rx refills may take up to 3 business days. We ask that you follow-up with your pharmacy   Patient also wants to schedule with lab regarding a urine exam.

## 2024-05-01 ENCOUNTER — Other Ambulatory Visit: Payer: Self-pay | Admitting: Physician Assistant

## 2024-05-01 DIAGNOSIS — F909 Attention-deficit hyperactivity disorder, unspecified type: Secondary | ICD-10-CM

## 2024-05-01 DIAGNOSIS — F5104 Psychophysiologic insomnia: Secondary | ICD-10-CM

## 2024-05-01 DIAGNOSIS — F339 Major depressive disorder, recurrent, unspecified: Secondary | ICD-10-CM

## 2024-05-01 DIAGNOSIS — F411 Generalized anxiety disorder: Secondary | ICD-10-CM

## 2024-05-01 MED ORDER — DOXEPIN HCL 25 MG PO CAPS: 25.0000 mg | ORAL_CAPSULE | Freq: Every day | ORAL | 1 refills | Status: DC

## 2024-05-01 MED ORDER — AMPHETAMINE-DEXTROAMPHETAMINE 30 MG PO TABS: ORAL_TABLET | ORAL | 0 refills | Status: DC

## 2024-05-02 ENCOUNTER — Other Ambulatory Visit: Payer: Self-pay | Admitting: Family Medicine

## 2024-05-02 DIAGNOSIS — M2550 Pain in unspecified joint: Secondary | ICD-10-CM

## 2024-05-03 ENCOUNTER — Other Ambulatory Visit: Payer: Self-pay | Admitting: Physician Assistant

## 2024-05-03 DIAGNOSIS — F5104 Psychophysiologic insomnia: Secondary | ICD-10-CM

## 2024-05-03 MED ORDER — ZOLPIDEM TARTRATE ER 12.5 MG PO TBCR: 12.5000 mg | EXTENDED_RELEASE_TABLET | Freq: Every evening | ORAL | 0 refills | Status: DC | PRN

## 2024-05-19 DIAGNOSIS — Z419 Encounter for procedure for purposes other than remedying health state, unspecified: Secondary | ICD-10-CM | POA: Diagnosis not present

## 2024-06-20 ENCOUNTER — Other Ambulatory Visit: Payer: Self-pay | Admitting: Family Medicine

## 2024-06-20 DIAGNOSIS — M2550 Pain in unspecified joint: Secondary | ICD-10-CM

## 2024-06-20 DIAGNOSIS — F909 Attention-deficit hyperactivity disorder, unspecified type: Secondary | ICD-10-CM

## 2024-06-20 NOTE — Telephone Encounter (Unsigned)
 Copied from CRM [id number]. Topic: Clinical - Medication Refill >> Jun 20, 2024  4:41 PM Delon T wrote: Medication: amphetamine -dextroamphetamine  (ADDERALL) 30 MG tablet celecoxib  (CELEBREX ) 200 MG capsule methocarbamol  (ROBAXIN ) 750 MG tablet  Has the patient contacted their pharmacy? No (Agent: If no, request that the patient contact the pharmacy for the refill. If patient does not wish to contact the pharmacy document the reason why and proceed with request.) (Agent: If yes, when and what did the pharmacy advise?)  This is the patient's preferred pharmacy:    [phone] [address], [place] - [address] [place] [address] Phone: [phone] Fax: [phone]   Is this the correct pharmacy for this prescription? Yes If no, delete pharmacy and type the correct one.   Has the prescription been filled recently? Yes  Is the patient out of the medication? Yes  Has the patient been seen for an appointment in the last year OR does the patient have an upcoming appointment? Yes  Can we respond through MyChart? Yes  Agent: Please be advised that Rx refills may take up to 3 business days. We ask that you follow-up with your pharmacy.

## 2024-06-21 ENCOUNTER — Other Ambulatory Visit: Payer: Self-pay | Admitting: Family Medicine

## 2024-06-21 ENCOUNTER — Encounter: Payer: Self-pay | Admitting: Family Medicine

## 2024-06-21 ENCOUNTER — Other Ambulatory Visit: Payer: Self-pay | Admitting: Physician Assistant

## 2024-06-21 DIAGNOSIS — F909 Attention-deficit hyperactivity disorder, unspecified type: Secondary | ICD-10-CM

## 2024-06-21 DIAGNOSIS — M2550 Pain in unspecified joint: Secondary | ICD-10-CM

## 2024-06-21 MED ORDER — AMPHETAMINE-DEXTROAMPHETAMINE 30 MG PO TABS: ORAL_TABLET | ORAL | 0 refills | Status: DC

## 2024-06-21 MED ORDER — CELECOXIB 200 MG PO CAPS: 200.0000 mg | ORAL_CAPSULE | Freq: Two times a day (BID) | ORAL | 1 refills | Status: DC

## 2024-06-21 MED ORDER — METHOCARBAMOL 750 MG PO TABS: 750.0000 mg | ORAL_TABLET | Freq: Three times a day (TID) | ORAL | 3 refills | Status: DC

## 2024-06-21 NOTE — Telephone Encounter (Signed)
 Patient called, left VM to return the call to the office of [facility]. She will need an appointment with a provider here until her [facility] appointment at [facility] in November. She's still an established patient at [facility], so this is how she will receive the refills.    Copied from CRM [id number]. Topic: Clinical - Medication Refill >> Jun 20, 2024  4:41 PM Delon T wrote: Medication: amphetamine -dextroamphetamine  (ADDERALL) 30 MG tablet celecoxib  (CELEBREX ) 200 MG capsule methocarbamol  (ROBAXIN ) 750 MG tablet  Has the patient contacted their pharmacy? No (Agent: If no, request that the patient contact the pharmacy for the refill. If patient does not wish to contact the pharmacy document the reason why and proceed with request.) (Agent: If yes, when and what did the pharmacy advise?)  This is the patient's preferred pharmacy:    [facility] [phone] [address], [place] - [address]  [place] [address] Phone: [phone] Fax: [phone]   Is this the correct pharmacy for this prescription? Yes If no, delete pharmacy and type the correct one.   Has the prescription been filled recently? Yes  Is the patient out of the medication? Yes  Has the patient been seen for an appointment in the last year OR does the patient have an upcoming appointment? Yes  Can we respond through MyChart? Yes  Agent: Please be advised that Rx refills may take up to 3 business days. We ask that you follow-up with your pharmacy. >> Jun 21, 2024  1:27 PM Larissa S wrote: Patient calling back to check status, advised of time frame and [facility] appointment scheduled.

## 2024-06-22 ENCOUNTER — Other Ambulatory Visit: Payer: Self-pay

## 2024-06-22 ENCOUNTER — Ambulatory Visit (INDEPENDENT_AMBULATORY_CARE_PROVIDER_SITE_OTHER)

## 2024-06-22 ENCOUNTER — Other Ambulatory Visit: Payer: Self-pay | Admitting: Family Medicine

## 2024-06-22 VITALS — BP 122/85 | HR 94 | Temp 97.5°F | Ht 68.0 in | Wt 196.1 lb

## 2024-06-22 DIAGNOSIS — F339 Major depressive disorder, recurrent, unspecified: Secondary | ICD-10-CM

## 2024-06-22 DIAGNOSIS — F411 Generalized anxiety disorder: Secondary | ICD-10-CM

## 2024-06-22 DIAGNOSIS — Z130 Encounter for screening for diseases of the blood and blood-forming organs and certain disorders involving the immune mechanism: Secondary | ICD-10-CM

## 2024-06-22 DIAGNOSIS — Z13228 Encounter for screening for other metabolic disorders: Secondary | ICD-10-CM

## 2024-06-22 DIAGNOSIS — Z1159 Encounter for screening for other viral diseases: Secondary | ICD-10-CM | POA: Diagnosis not present

## 2024-06-22 DIAGNOSIS — Z1329 Encounter for screening for other suspected endocrine disorder: Secondary | ICD-10-CM

## 2024-06-22 DIAGNOSIS — Z1321 Encounter for screening for nutritional disorder: Secondary | ICD-10-CM | POA: Diagnosis not present

## 2024-06-22 DIAGNOSIS — M2550 Pain in unspecified joint: Secondary | ICD-10-CM

## 2024-06-22 DIAGNOSIS — F909 Attention-deficit hyperactivity disorder, unspecified type: Secondary | ICD-10-CM | POA: Diagnosis not present

## 2024-06-22 DIAGNOSIS — F5104 Psychophysiologic insomnia: Secondary | ICD-10-CM

## 2024-06-22 DIAGNOSIS — Z1231 Encounter for screening mammogram for malignant neoplasm of breast: Secondary | ICD-10-CM | POA: Diagnosis not present

## 2024-06-22 MED ORDER — CELECOXIB 200 MG PO CAPS: 200.0000 mg | ORAL_CAPSULE | Freq: Two times a day (BID) | ORAL | 1 refills | Status: AC

## 2024-06-22 MED ORDER — CLONAZEPAM 0.5 MG PO TABS: 0.5000 mg | ORAL_TABLET | Freq: Two times a day (BID) | ORAL | 0 refills | Status: DC | PRN

## 2024-06-22 MED ORDER — HYDROXYZINE HCL 10 MG PO TABS: 10.0000 mg | ORAL_TABLET | Freq: Three times a day (TID) | ORAL | 1 refills | Status: DC | PRN

## 2024-06-22 MED ORDER — DULOXETINE HCL 60 MG PO CPEP: 60.0000 mg | ORAL_CAPSULE | Freq: Two times a day (BID) | ORAL | 1 refills | Status: AC

## 2024-06-22 MED ORDER — DOXEPIN HCL 25 MG PO CAPS
25.0000 mg | ORAL_CAPSULE | Freq: Every day | ORAL | 1 refills | Status: AC
Start: 2024-06-22 — End: ?

## 2024-06-22 MED ORDER — AMPHETAMINE-DEXTROAMPHETAMINE 30 MG PO TABS
ORAL_TABLET | ORAL | 0 refills | Status: DC
Start: 2024-06-22 — End: 2024-06-27

## 2024-06-22 MED ORDER — ZOLPIDEM TARTRATE ER 12.5 MG PO TBCR: 12.5000 mg | EXTENDED_RELEASE_TABLET | Freq: Every evening | ORAL | 0 refills | Status: DC | PRN

## 2024-06-22 MED ORDER — PROPRANOLOL HCL 20 MG PO TABS: 20.0000 mg | ORAL_TABLET | Freq: Two times a day (BID) | ORAL | 2 refills | Status: AC

## 2024-06-22 NOTE — Patient Instructions (Signed)
 VISIT SUMMARY: You came in today for medication management and follow-up. We discussed your anxiety, chronic joint pain, sleep disturbance, and eczema. We also reviewed your ADHD management and general health maintenance needs.  YOUR PLAN: GENERALIZED ANXIETY DISORDER AND DEPRESSION: You have significant anxiety that affects your daily life, especially related to travel and daily activities. Your current medication, Cymbalta , is not providing enough relief. -Start taking propranolol 20 mg twice daily for maintenance anxiety. -Use hydroxyzine  10 mg as needed for breakthrough anxiety. -You can use clonazepam for short-term use during travel. -You are referred to [facility] for therapy. -Follow up in 8 weeks to assess how the medications are working and any side effects.  FIBROMYALGIA AND CHRONIC JOINT PAIN: You have chronic joint pain and potential fibromyalgia. Cymbalta  helps with both anxiety and joint pain. -Continue taking Cymbalta  60 mg twice daily. -Consider gabapentin for pain and sleep if needed.  INSOMNIA: You have chronic insomnia that is currently managed with Ambien  12.5 mg ER  ATTENTION-DEFICIT HYPERACTIVITY DISORDER (ADHD): Your ADHD is managed with Adderall. Regular follow-ups are required for medication management. -Continue taking Adderall 30 mg up to three times daily. -Follow up in 8 weeks for medication review, which will also serve as your 66-month ADHD medication review.  GENERAL HEALTH MAINTENANCE: You are due for routine health maintenance screenings and vaccinations. -Order a mammogram at [facility]. -Schedule a Pap smear before the end of the year or early next year. -Order hepatitis C screening with your upcoming blood work. -Schedule fasting blood work to check kidney, liver function, thyroid, A1c, cholesterol panel, vitamin D, and blood counts.  If you have any problems before your next visit feel free to message me via MyChart (minor  issues or questions) or call the office, otherwise you may reach out to schedule an office visit.  Thank you! Saddie Sacks, PA-C

## 2024-06-23 DIAGNOSIS — L309 Dermatitis, unspecified: Secondary | ICD-10-CM

## 2024-06-23 NOTE — Assessment & Plan Note (Signed)
-   In the past, patient has tried and failed melatonin, benadryl, trazodone, Ambien  CR.  -Current regimen includes doxepin  and zolpidem  ER 12.5 mg, but sleep quality remains suboptimal. High dose of Adderall for ADHD may be contributing to sleep issues. - Will refill current meds without changes for now as she has appointment with psychiatry [facility]) next month.

## 2024-06-23 NOTE — Assessment & Plan Note (Signed)
 Per chart review, it appears that her previous provider at [facility] increased her Adderall from 30 mg daily to 30 mg up to 3 times daily. PDMP reviewed and use is consistent with what patient is reporting; no aberrancies. Will refill once for now while patient awaits appointment with [facility] for medication management. -90 mg of Adderall in one day is excessive as the max dose is usually 60 mg unless prescribed for unsual cases. Patient rarely taking 3 per day, usually 1-2 per day. Discussed that increased dosages of this medication can contribute to anxiety. - Would recommend Psych either taking over the prescription for closer follow up/management at such a high dose or us  discussing reducing the prescription to 60 tabs for her to take 30 mg up to twice per day.

## 2024-06-23 NOTE — Assessment & Plan Note (Addendum)
 Chronic anxiety exacerbated by travel and daily activities. Cymbalta  60 mg twice daily for management. Previous medications caused sedation. Potential fibromyalgia diagnosis due to coexisting symptoms and negative autoimmune workup. Propranolol preferred for managing physical symptoms of anxiety without sedation. - Prescribed propranolol 20 mg twice daily for maintenance anxiety and to help with physical symptoms (insomnia, tremor, palpitations, increased sweating, etc.) - Prescribed hydroxyzine  10 mg as needed for breakthrough anxiety. - Have provided patient with a short term (10 tablets) of Klonopin 0.5 mg for her to take over the next 2 weeks as she has upcoming travel and funeral to attend, which are anxiety provoking for her. Discussed that this is a one time prescription without refills as it is not recommended for her to be combining multiple benzos daily (zolpidem  and klonopin). - Referred to [facility] for therapy. - Scheduled follow-up in 8 weeks to assess medication efficacy and side effects. - Also has appointment with [facility] for medication management next month.

## 2024-06-23 NOTE — Assessment & Plan Note (Signed)
 Persistent joint pain and stiffness affecting most joints for years. Celebrex  provides some relief but is not a long-term solution due to potential renal and gastrointestinal side effects. - Autoimmune testing done by her previous PCP, [name], negative. Fibromylagia likely. L - Continue Celebrexand methocarbomol as needed for pain management - Likely also getting benefit from Cymbalta  120 mg per day  - Will cont to monitor

## 2024-06-23 NOTE — Progress Notes (Signed)
 Established Patient Office Visit  Subjective   Patient ID: Abigail Smith, female    DOB: May 10, 1981  Age: 43 y.o. MRN: [id number]  Chief Complaint  Patient presents with   Medical Management of Chronic Issues    Transfer of care    HPI  History of Present Illness   Abigail Smith is a 43 year old female who presents for medication management and follow-up. She is a previous patient of Abigail Smith, established at [facility] after Abigail Smith departure and then was informed that the new provider she saw was also leaving. So, she is now back at [facility] as an established patient to go over medication management and refills. She is a new patient to me whom I am inheriting today.   Anxiety symptoms - Significant anxiety, particularly related to travel and daily activities - Anxiety interferes with daily functioning despite mindfulness practices - Cymbalta  60 mg twice daily provides insufficient relief for anxiety - Previous trial of hydroxyzine  25-50 mg caused sedation - Valium found helpful as needed for anxiety in the past  - No prior use of Klonopin or propranolol  Chronic joint pain - Chronic joint pain managed with Celebrex  200 mg once daily (prescribed for twice daily use) - Uses methocarbamol  as needed for joint pain - Cymbalta  120 mg per day for suspected fibromyalgia - Current medication regimen effective  Sleep disturbance - Ambien  (zolpidem ) 12.5 mg extended release at bedtime is effective for sleep - Was previously taking a 5 mg IR Zolpidem  - Uses doxepin  25-50 mg most nights for sleep and reserves Zolipdem for as needed use.   Eczema - Uses triamcinolone  cream as needed for eczema  Other medications and supplements - Adderall 30 mg up to three times daily for ADHD.  - Vitamin D and magnesium supplementation          ROS Per HPI.    Objective:     BP 122/85 (BP Location: Left Arm, Patient Position: Sitting, Cuff Size: Normal)   Pulse 94   Temp (!) 97.5 F (36.4  C) (Oral)   Ht 5' 8 (1.727 m)   Wt 196 lb 1.3 oz (88.9 kg)   SpO2 97%   BMI 29.81 kg/m    Physical Exam Constitutional:      Comments: Patient is visibly anxious and shaking during visit  Eyes:     Pupils: Pupils are equal, round, and reactive to light.  Cardiovascular:     Rate and Rhythm: Normal rate and regular rhythm.     Heart sounds: Normal heart sounds.  Pulmonary:     Effort: Pulmonary effort is normal. No respiratory distress.     Breath sounds: Normal breath sounds.  Abdominal:     General: Abdomen is flat. Bowel sounds are normal.     Palpations: Abdomen is soft.  Musculoskeletal:        General: No swelling. Normal range of motion.     Cervical back: Neck supple.  Lymphadenopathy:     Cervical: No cervical adenopathy.  Skin:    General: Skin is warm and dry.  Neurological:     General: No focal deficit present.     Mental Status: She is alert.  Psychiatric:        Mood and Affect: Mood is anxious.        Speech: Speech normal.        Behavior: Behavior normal.        Thought Content: Thought content normal.  No results found for any visits on 06/22/24.    The 10-year ASCVD risk score (Arnett DK, et al., 2019) is: 0.5%    Assessment & Plan:   Screening mammogram for breast cancer -     3D Screening Mammogram, Left and Right; Future  Psychophysiological insomnia Assessment & Plan: - In the past, patient has tried and failed melatonin, benadryl, trazodone, Ambien  CR.  -Current regimen includes doxepin  and zolpidem  ER 12.5 mg, but sleep quality remains suboptimal. High dose of Adderall for ADHD may be contributing to sleep issues. - Will refill current meds without changes for now as she has appointment with psychiatry Abigail Smith) next month.   Orders: -     Zolpidem  Tartrate ER; Take 1 tablet (12.5 mg total) by mouth at bedtime as needed for sleep.  Dispense: 15 tablet; Refill: 0  GAD (generalized anxiety  disorder) Assessment & Plan: Chronic anxiety exacerbated by travel and daily activities. Cymbalta  60 mg twice daily for management. Previous medications caused sedation. Potential fibromyalgia diagnosis due to coexisting symptoms and negative autoimmune workup. Propranolol preferred for managing physical symptoms of anxiety without sedation. - Prescribed propranolol 20 mg twice daily for maintenance anxiety and to help with physical symptoms (insomnia, tremor, palpitations, increased sweating, etc.) - Prescribed hydroxyzine  10 mg as needed for breakthrough anxiety. - Have provided patient with a short term (10 tablets) of Klonopin 0.5 mg for her to take over the next 2 weeks as she has upcoming travel and funeral to attend, which are anxiety provoking for her. Discussed that this is a one time prescription without refills as it is not recommended for her to be combining multiple benzos daily (zolpidem  and klonopin). - Referred to Abigail Smith for therapy. - Scheduled follow-up in 8 weeks to assess medication efficacy and side effects. - Also has appointment with Abigail Smith for medication management next month.  Orders: -     DULoxetine  HCl; Take 1 capsule (60 mg total) by mouth 2 (two) times daily.  Dispense: 180 capsule; Refill: 1 -     Ambulatory referral to Psychology  Depression, recurrent -     Ambulatory referral to Psychology  Multiple joint pain Assessment & Plan: Persistent joint pain and stiffness affecting most joints for years. Celebrex  provides some relief but is not a long-term solution due to potential renal and gastrointestinal side effects. - Autoimmune testing done by her previous PCP, Abigail Smith, negative. Fibromylagia likely. L - Continue Celebrexand methocarbomol as needed for pain management - Likely also getting benefit from Cymbalta  120 mg per day  - Will cont to monitor  Orders: -     Celecoxib ; Take 1 capsule (200 mg total) by  mouth 2 (two) times daily.  Dispense: 60 capsule; Refill: 1  Attention deficit hyperactivity disorder (ADHD), unspecified ADHD type Assessment & Plan: Per chart review, it appears that her previous provider at [facility] increased her Adderall from 30 mg daily to 30 mg up to 3 times daily. PDMP reviewed and use is consistent with what patient is reporting; no aberrancies. Will refill once for now while patient awaits appointment with [facility] for medication management. -90 mg of Adderall in one day is excessive as the max dose is usually 60 mg unless prescribed for unsual cases. Patient rarely taking 3 per day, usually 1-2 per day. Discussed that increased dosages of this medication can contribute to anxiety. - Would recommend Psych either taking over the prescription for closer follow up/management at  such a high dose or us  discussing reducing the prescription to 60 tabs for her to take 30 mg up to twice per day.  Orders: -     Amphetamine -Dextroamphetamine ; Take 1 tablet by mouth in the morning. May also take 1-2 tablets daily as needed (Daily each afternoon PRN).  Dispense: 90 tablet; Refill: 0  Screening for endocrine, nutritional, metabolic and immunity disorder -     VITAMIN D 25 Hydroxy (Vit-D Deficiency, Fractures); Future -     CBC with Differential/Platelet; Future -     Hemoglobin A1c; Future -     Comprehensive metabolic panel with GFR; Future -     Lipid panel; Future -     TSH; Future  Screening for viral disease -     Hepatitis C antibody; Future  Other orders -     Propranolol HCl; Take 1 tablet (20 mg total) by mouth 2 (two) times daily.  Dispense: 180 tablet; Refill: 2 -     hydrOXYzine  HCl; Take 1 tablet (10 mg total) by mouth 3 (three) times daily as needed.  Dispense: 30 tablet; Refill: 1 -     clonazePAM; Take 1 tablet (0.5 mg total) by mouth 2 (two) times daily as needed for anxiety.  Dispense: 10 tablet; Refill: 0   Return in about 8 weeks (around  08/17/2024) for Mood.    Saddie JULIANNA Sacks, PA-C

## 2024-06-27 ENCOUNTER — Other Ambulatory Visit: Payer: Self-pay

## 2024-06-27 ENCOUNTER — Other Ambulatory Visit

## 2024-07-03 MED ORDER — PIMECROLIMUS 1 % EX CREA: TOPICAL_CREAM | Freq: Two times a day (BID) | CUTANEOUS | 0 refills | Status: AC

## 2024-07-03 MED ORDER — ZOLPIDEM TARTRATE 10 MG PO TABS: 10.0000 mg | ORAL_TABLET | Freq: Every evening | ORAL | 0 refills | Status: DC | PRN

## 2024-07-03 NOTE — Telephone Encounter (Signed)
 Received patient message stating that she was not able to pick up her Zolpidem  XR 12.5 mg due to insurance issues. Patient would like to go back to the 10 mg IR Zolpidem  that she was prescribed by [facility] in the past. PDMP reviewed and is consistent with reported use. No aberrancies. Will send in #15 tablets. Patient verbalized agreement in keeping her initial evaluation appointment with [facility] next month.  Will cont to monitor.

## 2024-07-14 ENCOUNTER — Encounter

## 2024-07-26 ENCOUNTER — Other Ambulatory Visit: Payer: Self-pay

## 2024-07-26 DIAGNOSIS — Z1231 Encounter for screening mammogram for malignant neoplasm of breast: Secondary | ICD-10-CM

## 2024-07-31 DIAGNOSIS — F909 Attention-deficit hyperactivity disorder, unspecified type: Secondary | ICD-10-CM

## 2024-07-31 MED ORDER — ZOLPIDEM TARTRATE 10 MG PO TABS: 10.0000 mg | ORAL_TABLET | Freq: Every evening | ORAL | 0 refills | Status: DC | PRN

## 2024-07-31 MED ORDER — AMPHETAMINE-DEXTROAMPHETAMINE 30 MG PO TABS: 30.0000 mg | ORAL_TABLET | Freq: Two times a day (BID) | ORAL | 0 refills | Status: DC

## 2024-08-17 ENCOUNTER — Ambulatory Visit (INDEPENDENT_AMBULATORY_CARE_PROVIDER_SITE_OTHER)

## 2024-08-17 VITALS — BP 112/73 | HR 102 | Temp 98.5°F | Ht 68.0 in | Wt 207.0 lb

## 2024-08-17 DIAGNOSIS — F339 Major depressive disorder, recurrent, unspecified: Secondary | ICD-10-CM | POA: Diagnosis not present

## 2024-08-17 DIAGNOSIS — F411 Generalized anxiety disorder: Secondary | ICD-10-CM | POA: Diagnosis not present

## 2024-08-17 DIAGNOSIS — Z0000 Encounter for general adult medical examination without abnormal findings: Secondary | ICD-10-CM | POA: Diagnosis not present

## 2024-08-17 DIAGNOSIS — M2550 Pain in unspecified joint: Secondary | ICD-10-CM | POA: Diagnosis not present

## 2024-08-17 DIAGNOSIS — F909 Attention-deficit hyperactivity disorder, unspecified type: Secondary | ICD-10-CM | POA: Diagnosis not present

## 2024-08-17 DIAGNOSIS — F5104 Psychophysiologic insomnia: Secondary | ICD-10-CM | POA: Diagnosis not present

## 2024-08-17 MED ORDER — HYDROXYZINE HCL 10 MG PO TABS: 10.0000 mg | ORAL_TABLET | Freq: Three times a day (TID) | ORAL | 1 refills | Status: DC | PRN

## 2024-08-17 NOTE — Assessment & Plan Note (Signed)
 Routine health maintenance up to date. Mammogram scheduled. Flu shots and tetanus current. Pap smear planned for January. - Scheduled mammogram for December 16th. - Plan Pap smear for January. - Schedule lab work post-Christmas. - Schedule follow-up in three months.

## 2024-08-17 NOTE — Assessment & Plan Note (Signed)
 Doxepin  25 mg nightly and reserving Zolpidem  10 mg IR prn (15 tabs per month). Sleep improved. Will cont to monitor.

## 2024-08-17 NOTE — Patient Instructions (Signed)
 VISIT SUMMARY: You came in today for a follow-up on your anxiety management. We discussed your current medications and their effectiveness, and made plans for continuing your treatment. We also reviewed your general health maintenance and scheduled upcoming screenings and lab work.  YOUR PLAN: ANXIETY DISORDER: Your anxiety has improved with hydroxyzine  and propranolol . Hydroxyzine  helps without causing too much drowsiness, and propranolol  is used as needed for physical symptoms. -Prescribed a 90-day supply of hydroxyzine . -Continue taking propranolol  as needed.  MAJOR DEPRESSIVE DISORDER: Your depression is well-controlled with Cymbalta . -Continue taking Cymbalta  as prescribed.  INSOMNIA: You are using Ambien  for sleep, and have switched to a short-acting form due to insurance issues. -Continue taking Ambien  10 mg as needed on bad nights and Doxepin  on the other nights.  ATTENTION-DEFICIT HYPERACTIVITY DISORDER:  -Continue taking Adderall 30 mg as prescribed. -We will reassess the dosage after the new year.  CHRONIC PAIN: Celebrex  is effective for managing your pain when taken as needed. -Continue taking Celebrex  as needed.  GENERAL HEALTH MAINTENANCE: Your routine health maintenance is up to date. We have scheduled your mammogram and planned for a Pap smear and lab work. -Mammogram scheduled for December 16th. -Pap smear planned for January. -Schedule lab work post-Christmas. -Schedule follow-up in three months.  If you have any problems before your next visit feel free to message me via MyChart (minor issues or questions) or call the office, otherwise you may reach out to schedule an office visit.  Thank you! Saddie Sacks, PA-C

## 2024-08-17 NOTE — Progress Notes (Signed)
 Established Patient Office Visit  Subjective   Patient ID: Abigail Smith, female    DOB: 01-11-1981  Age: 43 y.o. MRN: [id number]  Chief Complaint  Patient presents with   Medication Management    HPI  History of Present Illness   Abigail Smith is a 42 year old female who presents for follow-up of anxiety management.  Anxiety symptoms and management - Anxiety has improved over the past two months with hydroxyzine , which provides relief without excessive sedation and allows maintenance of daily activities. - Propranolol  is used on an as-needed basis for physical symptoms of anxiety, such as sweating and shaking, currently at the lowest dose. - Cymbalta  daily which she has been on for some time.   ADHD  - Symptoms well controlled on Adderall 30 mg BID. Previously decreased from 30 mg TID which was written by a previous provider.   Sleep disturbance - Continues to use Ambien  as needed for sleep; due to insurance issues, switched to a short-acting form and takes 10 mg as needed. - Regularly, she uses Doxepin  at bedtime for sleep and reserves the Ambien  for nights when the Doxepin  does not help.            ROS Per HPI.    Objective:     BP 112/73   Pulse (!) 102   Temp 98.5 F (36.9 C) (Oral)   Ht 5' 8 (1.727 m)   Wt 207 lb (93.9 kg)   LMP 08/03/2024   SpO2 99%   BMI 31.47 kg/m    Physical Exam Constitutional:      General: She is not in acute distress.    Appearance: Normal appearance.  Cardiovascular:     Rate and Rhythm: Normal rate and regular rhythm.     Heart sounds: Normal heart sounds. No murmur heard.    No friction rub. No gallop.  Pulmonary:     Effort: Pulmonary effort is normal. No respiratory distress.     Breath sounds: Normal breath sounds.  Musculoskeletal:        General: No swelling.  Skin:    General: Skin is warm and dry.  Neurological:     General: No focal deficit present.     Mental Status: She is alert.  Psychiatric:         Mood and Affect: Mood normal.        Behavior: Behavior normal.        Thought Content: Thought content normal.      No results found for any visits on 08/17/24.    The 10-year ASCVD risk score (Arnett DK, et al., 2019) is: 0.5%    Assessment & Plan:   GAD (generalized anxiety disorder) Assessment & Plan: Improved from last OV. Continue Cymbalta  60 mg BID. Propranolol  20 mg BID PRN for physical sx of anxiety (sweating, heart palpitations, tremor, etc). Hydroxyzine  10 mg has also been effective. Will refill for 90 d supply.  Pt cancelled appointment with [facility]. Plans to reschedule it for beginning of year. Will follow up on mood again in 3 months.   Depression, recurrent Assessment & Plan: Continue Cymbalta  60 mg daily, add doxepin  25 mg nightly at bedtime.   Attention deficit hyperactivity disorder (ADHD), unspecified ADHD type Assessment & Plan: Stable and well managed with Adderall 30 mg BID. Not yet due for refill. Pt was previously on 30 mg TID prescribed by previous PCP. Discussed that 90 mg / day is very large dose and would  be better monitored by psychiatry if she wants to ever go back up to this dose. Patient agreeable to continuing 30 mg BID. Not yet due for refill. Follow up in 3 months.   Psychophysiological insomnia Assessment & Plan: Doxepin  25 mg nightly and reserving Zolpidem  10 mg IR prn (15 tabs per month). Sleep improved. Will cont to monitor.   Healthcare maintenance Assessment & Plan: Routine health maintenance up to date. Mammogram scheduled. Flu shots and tetanus current. Pap smear planned for January. - Scheduled mammogram for December 16th. - Plan Pap smear for January. - Schedule lab work post-Christmas. - Schedule follow-up in three months.    Multiple joint pain Assessment & Plan: Stable with Celebrex  and Methocarbomol as needed. Suspect given symptoms of depression/anxiety and chronic joint pain, there may be a possibility  of fibromyalgia. Previous autoimmune workup in July 2025 negative. Will cont to monitor.   Other orders -     hydrOXYzine  HCl; Take 1 tablet (10 mg total) by mouth 3 (three) times daily as needed.  Dispense: 90 tablet; Refill: 1      Return in about 3 months (around 11/15/2024) for Mood, ADHD, go over blood work.    Abigail JULIANNA Sacks, PA-C

## 2024-08-17 NOTE — Assessment & Plan Note (Signed)
 Stable and well managed with Adderall 30 mg BID. Not yet due for refill. Pt was previously on 30 mg TID prescribed by previous PCP. Discussed that 90 mg / day is very large dose and would be better monitored by psychiatry if she wants to ever go back up to this dose. Patient agreeable to continuing 30 mg BID. Not yet due for refill. Follow up in 3 months.

## 2024-08-17 NOTE — Assessment & Plan Note (Signed)
 Continue Cymbalta  60 mg daily, add doxepin  25 mg nightly at bedtime.

## 2024-08-17 NOTE — Assessment & Plan Note (Signed)
 Stable with Celebrex  and Methocarbomol as needed. Suspect given symptoms of depression/anxiety and chronic joint pain, there may be a possibility of fibromyalgia. Previous autoimmune workup in July 2025 negative. Will cont to monitor.

## 2024-08-17 NOTE — Assessment & Plan Note (Signed)
 Improved from last OV. Continue Cymbalta  60 mg BID. Propranolol  20 mg BID PRN for physical sx of anxiety (sweating, heart palpitations, tremor, etc). Hydroxyzine  10 mg has also been effective. Will refill for 90 d supply.  Pt cancelled appointment with [facility]. Plans to reschedule it for beginning of year. Will follow up on mood again in 3 months.

## 2024-08-22 ENCOUNTER — Ambulatory Visit

## 2024-08-22 DIAGNOSIS — Z1231 Encounter for screening mammogram for malignant neoplasm of breast: Secondary | ICD-10-CM

## 2024-08-29 ENCOUNTER — Other Ambulatory Visit: Payer: Self-pay

## 2024-08-29 DIAGNOSIS — F5104 Psychophysiologic insomnia: Secondary | ICD-10-CM

## 2024-08-29 MED ORDER — ZOLPIDEM TARTRATE 10 MG PO TABS: 10.0000 mg | ORAL_TABLET | Freq: Every evening | ORAL | 0 refills | Status: DC | PRN

## 2024-09-01 ENCOUNTER — Other Ambulatory Visit: Payer: Self-pay | Admitting: Family Medicine

## 2024-09-01 DIAGNOSIS — L209 Atopic dermatitis, unspecified: Secondary | ICD-10-CM

## 2024-09-04 DIAGNOSIS — M2550 Pain in unspecified joint: Secondary | ICD-10-CM

## 2024-09-04 DIAGNOSIS — F909 Attention-deficit hyperactivity disorder, unspecified type: Secondary | ICD-10-CM

## 2024-09-04 MED ORDER — AMPHETAMINE-DEXTROAMPHETAMINE 30 MG PO TABS: 30.0000 mg | ORAL_TABLET | Freq: Two times a day (BID) | ORAL | 0 refills | Status: DC

## 2024-09-21 ENCOUNTER — Other Ambulatory Visit: Payer: Self-pay

## 2024-09-27 ENCOUNTER — Other Ambulatory Visit: Payer: Self-pay

## 2024-09-27 DIAGNOSIS — F5104 Psychophysiologic insomnia: Secondary | ICD-10-CM

## 2024-09-27 MED ORDER — METHOCARBAMOL 750 MG PO TABS: 750.0000 mg | ORAL_TABLET | Freq: Three times a day (TID) | ORAL | 1 refills | Status: AC | PRN

## 2024-09-27 NOTE — Addendum Note (Signed)
 Addended byBETHA GAYLE NUMBERS on: 09/27/2024 09:02 PM   Modules accepted: Orders

## 2024-10-12 ENCOUNTER — Other Ambulatory Visit: Payer: Self-pay

## 2024-10-12 DIAGNOSIS — F909 Attention-deficit hyperactivity disorder, unspecified type: Secondary | ICD-10-CM

## 2024-10-12 MED ORDER — AMPHETAMINE-DEXTROAMPHETAMINE 30 MG PO TABS: 30.0000 mg | ORAL_TABLET | Freq: Two times a day (BID) | ORAL | 0 refills | Status: AC

## 2024-11-08 ENCOUNTER — Other Ambulatory Visit

## 2024-11-15 ENCOUNTER — Ambulatory Visit
